# Patient Record
Sex: Male | Born: 2000 | Race: Black or African American | Hispanic: No | Marital: Single | Smoking: Current every day smoker
Health system: Southern US, Community
[De-identification: ages and names within clinical notes are randomized; demographics above are authoritative.]

## PROBLEM LIST (undated history)

## (undated) DIAGNOSIS — Z21 Asymptomatic human immunodeficiency virus [HIV] infection status: Secondary | ICD-10-CM

## (undated) DIAGNOSIS — B20 Human immunodeficiency virus [HIV] disease: Secondary | ICD-10-CM

## (undated) HISTORY — DX: Asymptomatic human immunodeficiency virus (hiv) infection status: Z21

## (undated) HISTORY — DX: Human immunodeficiency virus (HIV) disease: B20

---
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
## 2022-02-11 ENCOUNTER — Other Ambulatory Visit: Payer: Self-pay | Admitting: Internal Medicine

## 2022-04-20 ENCOUNTER — Other Ambulatory Visit: Payer: Self-pay | Admitting: Internal Medicine

## 2022-04-20 DIAGNOSIS — B20 Human immunodeficiency virus [HIV] disease: Secondary | ICD-10-CM

## 2022-05-02 ENCOUNTER — Other Ambulatory Visit: Payer: Medicaid Other

## 2022-05-02 ENCOUNTER — Other Ambulatory Visit: Payer: Self-pay

## 2022-05-02 DIAGNOSIS — B20 Human immunodeficiency virus [HIV] disease: Secondary | ICD-10-CM

## 2022-05-02 DIAGNOSIS — Z113 Encounter for screening for infections with a predominantly sexual mode of transmission: Secondary | ICD-10-CM

## 2022-05-02 DIAGNOSIS — Z79899 Other long term (current) drug therapy: Secondary | ICD-10-CM

## 2022-05-03 ENCOUNTER — Ambulatory Visit: Payer: Medicaid Other | Admitting: Internal Medicine

## 2022-05-06 ENCOUNTER — Other Ambulatory Visit: Payer: Medicaid Other

## 2022-05-10 ENCOUNTER — Ambulatory Visit: Payer: Medicaid Other | Admitting: Internal Medicine

## 2022-06-06 MED ORDER — BIKTARVY 50-200-25 MG PO TABS: 1.0000 | ORAL_TABLET | Freq: Every day | ORAL | 0 refills | Status: DC

## 2022-06-13 ENCOUNTER — Other Ambulatory Visit: Payer: Medicaid Other

## 2022-06-13 ENCOUNTER — Other Ambulatory Visit: Payer: Self-pay | Admitting: Internal Medicine

## 2022-06-13 ENCOUNTER — Other Ambulatory Visit (HOSPITAL_COMMUNITY)
Admission: RE | Admit: 2022-06-13 | Discharge: 2022-06-13 | Disposition: A | Discharge status: Home / Self Care | Payer: Medicaid Other | Source: Ambulatory Visit | Attending: Internal Medicine | Admitting: Internal Medicine

## 2022-06-13 ENCOUNTER — Other Ambulatory Visit: Payer: Self-pay

## 2022-06-13 DIAGNOSIS — Z79899 Other long term (current) drug therapy: Secondary | ICD-10-CM

## 2022-06-13 DIAGNOSIS — B20 Human immunodeficiency virus [HIV] disease: Secondary | ICD-10-CM | POA: Insufficient documentation

## 2022-06-13 DIAGNOSIS — Z113 Encounter for screening for infections with a predominantly sexual mode of transmission: Secondary | ICD-10-CM | POA: Diagnosis present

## 2022-06-15 LAB — URINE CYTOLOGY ANCILLARY ONLY
Chlamydia: NEGATIVE
Comment: NEGATIVE
Comment: NORMAL
Neisseria Gonorrhea: NEGATIVE

## 2022-06-16 LAB — CBC WITH DIFFERENTIAL/PLATELET
Absolute Monocytes: 383 cells/uL (ref 200–950)
Basophils Absolute: 29 cells/uL (ref 0–200)
Basophils Relative: 0.5 %
Eosinophils Absolute: 203 cells/uL (ref 15–500)
Eosinophils Relative: 3.5 %
HCT: 44 % (ref 38.5–50.0)
Hemoglobin: 14.5 g/dL (ref 13.2–17.1)
Lymphs Abs: 2946 cells/uL (ref 850–3900)
MCH: 27.2 pg (ref 27.0–33.0)
MCHC: 33 g/dL (ref 32.0–36.0)
MCV: 82.4 fL (ref 80.0–100.0)
MPV: 9.6 fL (ref 7.5–12.5)
Monocytes Relative: 6.6 %
Neutro Abs: 2239 cells/uL (ref 1500–7800)
Neutrophils Relative %: 38.6 %
Platelets: 249 10*3/uL (ref 140–400)
RBC: 5.34 10*6/uL (ref 4.20–5.80)
RDW: 14.2 % (ref 11.0–15.0)
Total Lymphocyte: 50.8 %
WBC: 5.8 10*3/uL (ref 3.8–10.8)

## 2022-06-16 LAB — LIPID PANEL
Cholesterol: 200 mg/dL — ABNORMAL HIGH (ref <170)
HDL: 66 mg/dL (ref >45)
LDL Cholesterol (Calc): 120 mg/dL (calc) — ABNORMAL HIGH (ref <110)
Non-HDL Cholesterol (Calc): 134 mg/dL (calc) — ABNORMAL HIGH (ref <120)
Total CHOL/HDL Ratio: 3 (calc) (ref <5.0)
Triglycerides: 47 mg/dL (ref <90)

## 2022-06-16 LAB — T-HELPER CELLS (CD4) COUNT (NOT AT ARMC)
Absolute CD4: 921 cells/uL (ref 490–1740)
CD4 T Helper %: 31 % (ref 30–61)
Total lymphocyte count: 2973 cells/uL (ref 850–3900)

## 2022-06-16 LAB — COMPLETE METABOLIC PANEL WITH GFR
AG Ratio: 1.5 (calc) (ref 1.0–2.5)
ALT: 15 U/L (ref 8–46)
AST: 15 U/L (ref 12–32)
Albumin: 4.4 g/dL (ref 3.6–5.1)
Alkaline phosphatase (APISO): 70 U/L (ref 46–169)
BUN: 10 mg/dL (ref 7–20)
CO2: 26 mmol/L (ref 20–32)
Calcium: 9.3 mg/dL (ref 8.9–10.4)
Chloride: 103 mmol/L (ref 98–110)
Creat: 1.08 mg/dL (ref 0.60–1.24)
Globulin: 3 g/dL (calc) (ref 2.1–3.5)
Glucose, Bld: 82 mg/dL (ref 65–99)
Potassium: 4.2 mmol/L (ref 3.8–5.1)
Sodium: 137 mmol/L (ref 135–146)
Total Bilirubin: 0.4 mg/dL (ref 0.2–1.1)
Total Protein: 7.4 g/dL (ref 6.3–8.2)
eGFR: 101 mL/min/{1.73_m2} (ref >=60)

## 2022-06-16 LAB — HIV-1 RNA QUANT-NO REFLEX-BLD
HIV 1 RNA Quant: <20 Copies/mL — ABNORMAL HIGH
HIV-1 RNA Quant, Log: <1.3 Log cps/mL — ABNORMAL HIGH

## 2022-06-16 LAB — RPR: RPR Ser Ql: NONREACTIVE

## 2022-06-21 ENCOUNTER — Telehealth (INDEPENDENT_AMBULATORY_CARE_PROVIDER_SITE_OTHER): Payer: Medicaid Other | Admitting: Internal Medicine

## 2022-06-21 ENCOUNTER — Other Ambulatory Visit: Payer: Self-pay

## 2022-06-21 DIAGNOSIS — B20 Human immunodeficiency virus [HIV] disease: Secondary | ICD-10-CM

## 2022-06-21 DIAGNOSIS — Z79899 Other long term (current) drug therapy: Secondary | ICD-10-CM | POA: Diagnosis not present

## 2022-06-21 MED ORDER — BIKTARVY 50-200-25 MG PO TABS: 1.0000 | ORAL_TABLET | Freq: Every day | ORAL | 11 refills | Status: DC

## 2022-06-21 NOTE — Progress Notes (Signed)
Virtual Visit via Telephone Note  I connected with Kyle Raymond on 06/21/22 at  3:15 PM EDT by telephone and verified that I am speaking with the correct person using two identifiers.  Location: Patient: at home Provider: in clinic   I discussed the limitations, risks, security and privacy concerns of performing an evaluation and management service by telephone and the availability of in person appointments. I also discussed with the patient that there may be a patient responsible charge related to this service. The patient expressed understanding and agreed to proceed.   History of Present Illness: Doing well overall. He has been taking medications daily. No new partners. Continues to work as [employer].   Observations/Objective: Gen = a x o by 3 in nad HEENT = PERRLA, EOMI, no scleral icterus Pulm = no tachypnea nor accessory muscle use Skin = no signs of rash  Assessment and Plan: Hiv disease = reviewed lab results with patient; he is well controlled. Continue on bitkarvy  Long term medication management = cr is stable  Health maintenance = recommend flu and covid booster in the Fall  Follow Up Instructions: See back in 3-6 mo.   I discussed the assessment and treatment plan with the patient. The patient was provided an opportunity to ask questions and all were answered. The patient agreed with the plan and demonstrated an understanding of the instructions.   The patient was advised to call back or seek an in-person evaluation if the symptoms worsen or if the condition fails to improve as anticipated.  I provided 15 minutes of non-face-to-face time during this encounter.   Judyann Munson, MD

## 2022-08-16 ENCOUNTER — Other Ambulatory Visit (HOSPITAL_COMMUNITY): Payer: Self-pay

## 2022-08-16 ENCOUNTER — Encounter: Payer: Self-pay | Admitting: Internal Medicine

## 2022-08-17 ENCOUNTER — Other Ambulatory Visit: Payer: Self-pay | Admitting: Pharmacist

## 2022-08-17 DIAGNOSIS — B20 Human immunodeficiency virus [HIV] disease: Secondary | ICD-10-CM

## 2022-08-17 NOTE — Progress Notes (Signed)
hiv

## 2022-08-17 NOTE — Telephone Encounter (Signed)
Orders placed. Thanks Cece!

## 2022-08-25 ENCOUNTER — Other Ambulatory Visit: Payer: Medicaid Other

## 2022-08-25 ENCOUNTER — Other Ambulatory Visit: Payer: Self-pay

## 2022-08-25 DIAGNOSIS — B20 Human immunodeficiency virus [HIV] disease: Secondary | ICD-10-CM

## 2022-08-28 LAB — HIV-1 RNA QUANT-NO REFLEX-BLD
HIV 1 RNA Quant: NOT DETECTED Copies/mL
HIV-1 RNA Quant, Log: NOT DETECTED Log cps/mL

## 2022-08-28 LAB — RPR: RPR Ser Ql: NONREACTIVE

## 2022-11-29 ENCOUNTER — Other Ambulatory Visit: Payer: Self-pay

## 2022-11-29 ENCOUNTER — Emergency Department (HOSPITAL_BASED_OUTPATIENT_CLINIC_OR_DEPARTMENT_OTHER)
Admission: EM | Admit: 2022-11-29 | Discharge: 2022-11-30 | Disposition: A | Discharge status: Home / Self Care | Payer: Medicaid Other | Attending: Emergency Medicine | Admitting: Emergency Medicine

## 2022-11-29 ENCOUNTER — Encounter (HOSPITAL_BASED_OUTPATIENT_CLINIC_OR_DEPARTMENT_OTHER): Payer: Self-pay

## 2022-11-29 DIAGNOSIS — H9201 Otalgia, right ear: Secondary | ICD-10-CM | POA: Diagnosis present

## 2022-11-29 DIAGNOSIS — Z5321 Procedure and treatment not carried out due to patient leaving prior to being seen by health care provider: Secondary | ICD-10-CM | POA: Insufficient documentation

## 2022-11-29 MED ORDER — IBUPROFEN 800 MG PO TABS
800.0000 mg | ORAL_TABLET | Freq: Once | ORAL | Status: AC
  Administered 2022-11-29: 800 mg via ORAL

## 2022-11-29 NOTE — ED Triage Notes (Signed)
Pt states that this morning he started having right ear pain that has now radiated to his left ear, as well.

## 2022-12-01 ENCOUNTER — Telehealth: Payer: Self-pay

## 2022-12-01 ENCOUNTER — Other Ambulatory Visit: Payer: Self-pay | Admitting: Pharmacist

## 2022-12-01 ENCOUNTER — Encounter: Payer: Self-pay | Admitting: Pharmacist

## 2022-12-01 ENCOUNTER — Other Ambulatory Visit (HOSPITAL_COMMUNITY): Payer: Self-pay

## 2022-12-01 DIAGNOSIS — B20 Human immunodeficiency virus [HIV] disease: Secondary | ICD-10-CM

## 2022-12-01 MED ORDER — CABOTEGRAVIR & RILPIVIRINE ER 600 & 900 MG/3ML IM SUER
1.0000 | INTRAMUSCULAR | 1 refills | Status: DC
  Filled 2022-12-01 – 2022-12-02 (×2): qty 6, 30d supply, fill #0
  Filled 2022-12-27: qty 6, 30d supply, fill #1

## 2022-12-01 MED ORDER — CABOTEGRAVIR & RILPIVIRINE ER 600 & 900 MG/3ML IM SUER
1.0000 | INTRAMUSCULAR | 5 refills | Status: DC
  Filled 2022-12-01: qty 6, 60d supply, fill #0
  Filled 2023-02-24: qty 6, 34d supply, fill #0
  Filled 2023-04-28: qty 6, 34d supply, fill #1
  Filled 2023-06-27: qty 6, 34d supply, fill #2
  Filled 2023-08-31: qty 6, 34d supply, fill #3
  Filled 2023-10-18: qty 6, 34d supply, fill #4

## 2022-12-01 NOTE — Telephone Encounter (Signed)
Patient sent mychart message requesting appointment for today to start cabenuva. Spoke with pharmacy team who did not have any open appointment slots to start cabenuva today.  Messaged patient back stating unfortunately we do not have open appointments today, but someone from the pharmacy team would reach out to him today to help schedule appointment. Juanita Laster, RMA

## 2022-12-02 ENCOUNTER — Other Ambulatory Visit (HOSPITAL_COMMUNITY): Payer: Self-pay

## 2022-12-06 ENCOUNTER — Other Ambulatory Visit: Payer: Self-pay

## 2022-12-07 ENCOUNTER — Telehealth: Payer: Self-pay

## 2022-12-07 NOTE — Telephone Encounter (Signed)
[facility] Patient Advocate Encounter  Patient's medication (Cabenuva) have been couriered to [facility] from [facility] and will be administered on the patient next office visit on 12/13/22.  Jaeline Whobrey , CPhT Specialty Pharmacy Patient [facility] Phone: [phone] Fax:  [phone]  

## 2022-12-14 ENCOUNTER — Ambulatory Visit (INDEPENDENT_AMBULATORY_CARE_PROVIDER_SITE_OTHER): Payer: Medicaid Other | Admitting: Pharmacist

## 2022-12-14 ENCOUNTER — Other Ambulatory Visit: Payer: Self-pay

## 2022-12-14 DIAGNOSIS — B20 Human immunodeficiency virus [HIV] disease: Secondary | ICD-10-CM

## 2022-12-14 MED ORDER — CABOTEGRAVIR & RILPIVIRINE ER 600 & 900 MG/3ML IM SUER
1.0000 | Freq: Once | INTRAMUSCULAR | Status: AC
  Administered 2022-12-14: 1 via INTRAMUSCULAR

## 2022-12-14 NOTE — Progress Notes (Signed)
HPI: Kyle Raymond is a 22 y.o. male who presents to the [place] clinic for [place] administration.  Patient Active Problem List   Diagnosis Date Noted   Healthcare maintenance 08/05/2020   HIV (human immunodeficiency virus infection) ([place]) 06/09/2020   Nausea & vomiting 06/09/2020    Patient's Medications  New Prescriptions   No medications on file  Previous Medications   AZITHROMYCIN (ZITHROMAX Z-PAK) 250 MG TABLET    Take 2 tablets daily for five days.   BICTEGRAVIR-EMTRICITABINE-TENOFOVIR AF (BIKTARVY) 50-200-25 MG TABS TABLET    Take 1 tablet by mouth daily.   CABOTEGRAVIR & RILPIVIRINE ER (CABENUVA) 600 & 900 MG/3ML INJECTION    Inject 1 kit into the muscle every 2 (two) months.   CABOTEGRAVIR & RILPIVIRINE ER (CABENUVA) 600 & 900 MG/3ML INJECTION    Inject 1 kit into the muscle every 30 (thirty) days.   DICYCLOMINE (BENTYL) 10 MG CAPSULE    Take 1 capsule (10 mg total) by mouth 4 (four) times daily -  before meals and at bedtime for 5 days.   MOMETASONE (NASONEX) 50 MCG/ACT NASAL SPRAY    Place 2 sprays into the nose daily.   ONDANSETRON (ZOFRAN) 4 MG TABLET    Take 1 tablet (4 mg total) by mouth every 6 (six) hours.  Modified Medications   No medications on file  Discontinued Medications   No medications on file    Allergies: No Known Allergies  Past Medical History: Past Medical History:  Diagnosis Date   HIV infection ([place])     Social History: Social History   Socioeconomic History   Marital status: Single    Spouse name: Not on file   Number of children: Not on file   Years of education: Not on file   Highest education level: Not on file  Occupational History   Not on file  Tobacco Use   Smoking status: Former   Smokeless tobacco: Never  Substance and Sexual Activity   Alcohol use: Not Currently   Drug use: Yes    Frequency: 2.0 times per week    Types: Marijuana   Sexual activity: Not Currently    Partners: Male    Birth  control/protection: Condom    Comment: declined condoms 20/2022  Other Topics Concern   Not on file  Social History Narrative   Not on file   Social Determinants of Health   Financial Resource Strain: Not on file  Food Insecurity: Not on file  Transportation Needs: Not on file  Physical Activity: Not on file  Stress: Not on file  Social Connections: Not on file    Labs: Lab Results  Component Value Date   HIV1RNAQUANT Not Detected 08/25/2022   HIV1RNAQUANT <20 (H) 06/13/2022   HIV1RNAQUANT <20 (H) 05/20/2021   CD4TABS 685 02/03/2021   CD4TABS 665 10/27/2020   CD4TABS 491 07/06/2020    RPR and STI Lab Results  Component Value Date   LABRPR NON-REACTIVE 08/25/2022   LABRPR NON-REACTIVE 06/13/2022   LABRPR Non Reactive 09/23/2021   LABRPR Non Reactive 08/30/2021   LABRPR REACTIVE (A) 02/03/2021   RPRTITER 1:1 (H) 02/03/2021   RPRTITER 1:2 (H) 10/27/2020    STI Results GC CT  06/13/2022  3:51 PM Negative  Negative   09/23/2021 12:49 PM Negative  Negative   08/30/2021 10:19 AM Negative  Negative   05/20/2021  5:19 PM Negative  Negative   06/09/2020 10:12 AM Negative  Negative     Hepatitis B Lab  Results  Component Value Date   HEPBSAB NON-REACTIVE 06/09/2020   HEPBSAG NON-REACTIVE 06/09/2020   HEPBCAB NON-REACTIVE 06/09/2020   Hepatitis C Lab Results  Component Value Date   HEPCAB NON-REACTIVE 06/09/2020   Hepatitis A Lab Results  Component Value Date   HAV REACTIVE (A) 06/09/2020   Lipids: Lab Results  Component Value Date   CHOL 200 (H) 06/13/2022   TRIG 47 06/13/2022   HDL 66 06/13/2022   CHOLHDL 3.0 06/13/2022   LDLCALC 120 (H) 06/13/2022    Current HIV Regimen: Biktarvy  TARGET DATE: The 3rd of the month  Assessment: [name] presents today for their first initiation injection for Cabenuva. Counseled that [place] is two separate intramuscular injections in the gluteal muscle on each side for each visit. Explained that the second injection  is 30 days after the initial injection then every 2 months thereafter. Discussed the need for viral load monitoring every 2 months for the first 6 months and then periodically afterwards as their provider sees the need. Discussed the rare but significant chance of developing resistance despite compliance. Explained that showing up to injection appointments is very important and warned that if 2 appointments are missed, it will be reassessed by their provider whether they are a good candidate for injection therapy. Counseled on possible side effects associated with the injections such as injection site pain, which is usually mild to moderate in nature, injection site nodules, and injection site reactions. Asked to call the clinic or send me a mychart message if they experience any issues, such as fatigue, nausea, headache, rash, or dizziness. Advised that they can take ibuprofen or tylenol for injection site pain if needed.   Administered cabotegravir 629m/3mL in left upper outer quadrant of the gluteal muscle. Administered rilpivirine 900 mg/374min the right upper outer quadrant of the gluteal muscle. Monitored patient for 10 minutes after injection. Injections were tolerated well without issue. Counseled to stop taking Biktarvy after today's dose and to call with any issues that may arise. Will make follow up appointments for second initiation injection in 30 days and then maintenance injections every 2 months thereafter for 6 months.   Patient requesting HIV viral load today which will be assessed. Reviewed that we will continue monitoring at his next appointments.   Plan: - Stop [place]jections administered - Second initiation injection scheduled for 1/30 with me  - Maintenance injections scheduled for 3/27 with me, 5/28 with Dr. SnBaxter Flatteryand 7/30 with me  - Call with any issues or questions   AmAlfonse SprucePharmD, CPP, BCEhrhardtAARolling Fieldsharmacist Practitioner Infectious  Diseases [facility]

## 2022-12-18 LAB — HIV-1 RNA QUANT-NO REFLEX-BLD
HIV 1 RNA Quant: 110 Copies/mL — ABNORMAL HIGH
HIV-1 RNA Quant, Log: 2.04 Log cps/mL — ABNORMAL HIGH

## 2022-12-19 NOTE — Progress Notes (Signed)
Patient's viral load slightly increased while on oral antiretrovirals. Will repeat HIV RNA in one month at second [place] II injection appointment. Estill Bamberg

## 2022-12-23 IMAGING — CT CT ABD-PELV W/ CM
2 of 4 series · 16 of 46 positions shown, 18 images · IV contrast (omnipaque)
Comparison: None.

CLINICAL DATA: Abdomen pain with diarrhea

EXAM:
CT ABDOMEN AND PELVIS WITH CONTRAST
TECHNIQUE: Multidetector CT imaging of the abdomen and pelvis was performed
using the standard protocol following bolus administration of
intravenous contrast.
CONTRAST:  100mL OMNIPAQUE IOHEXOL 300 MG/ML  SOLN

[Series 2: routine abd/pel with · axial · 0.81mm/px · z∈[-1037,-597]mm · 13 of 98 slices shown, 15 images]
[im 5/98  soft-tissue]
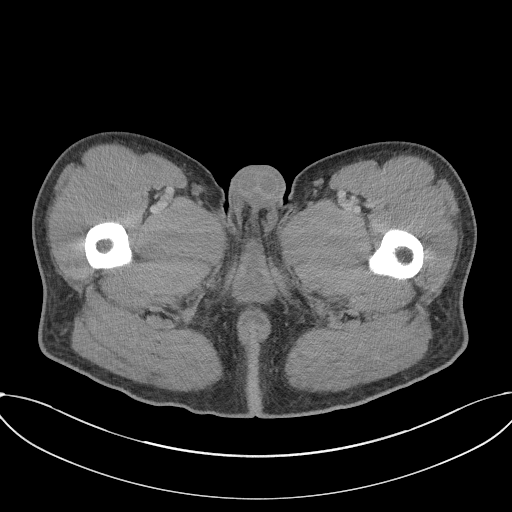
[im 5/98  bone]
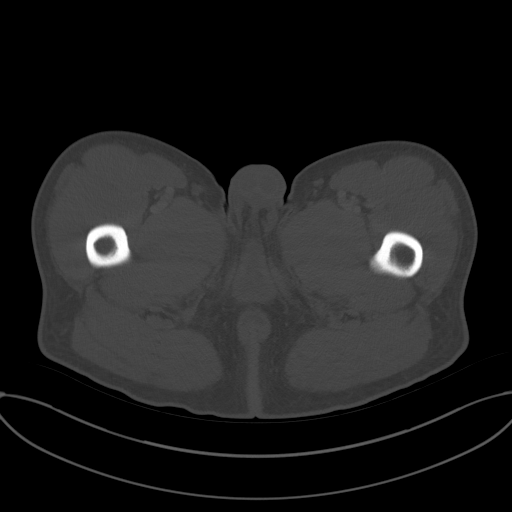
[im 13/98  soft-tissue]
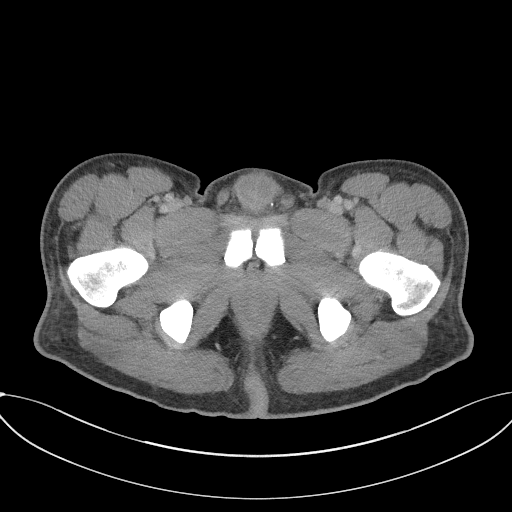
[im 21/98  soft-tissue]
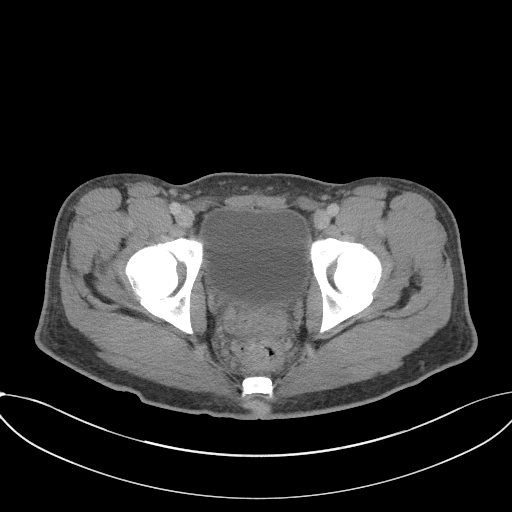
[im 29/98  soft-tissue]
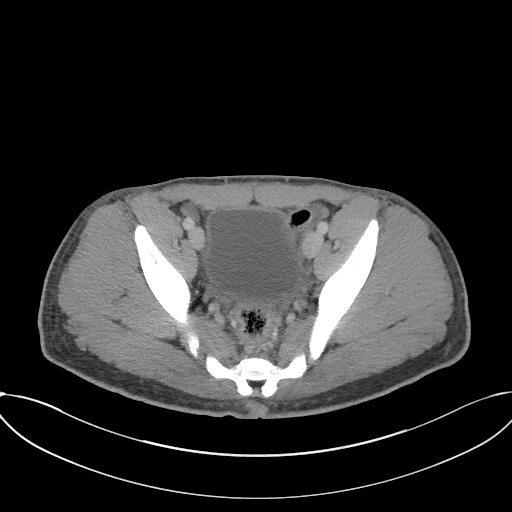
[im 33/98  soft-tissue]
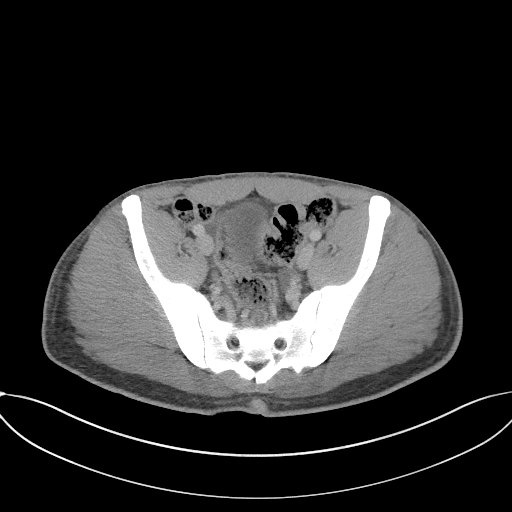
[im 41/98  soft-tissue]
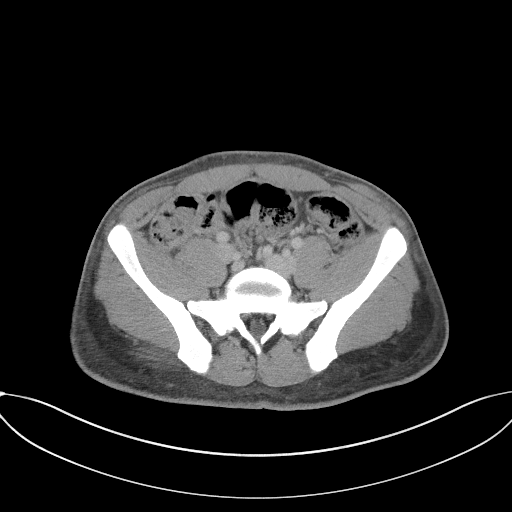
[im 49/98  soft-tissue]
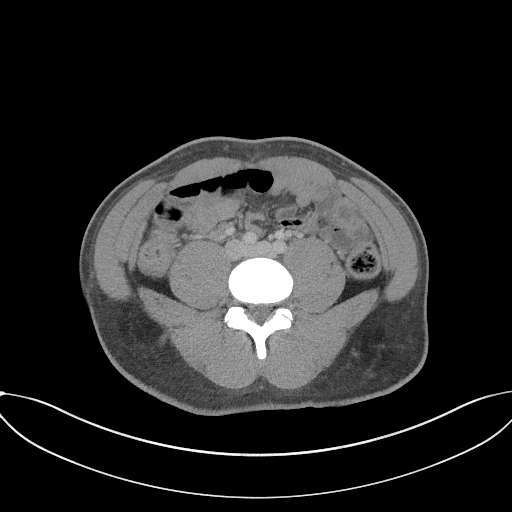
[im 57/98  soft-tissue]
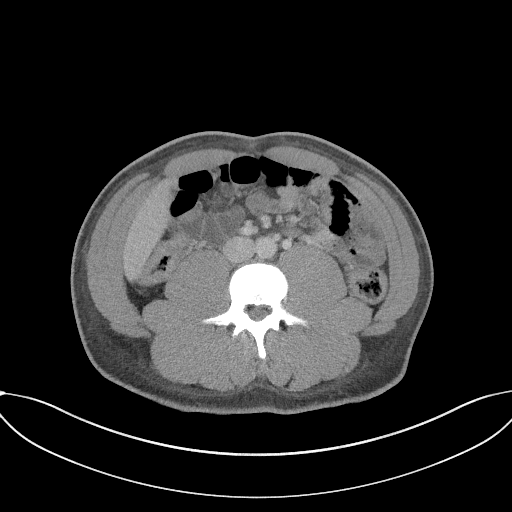
[im 65/98  soft-tissue]
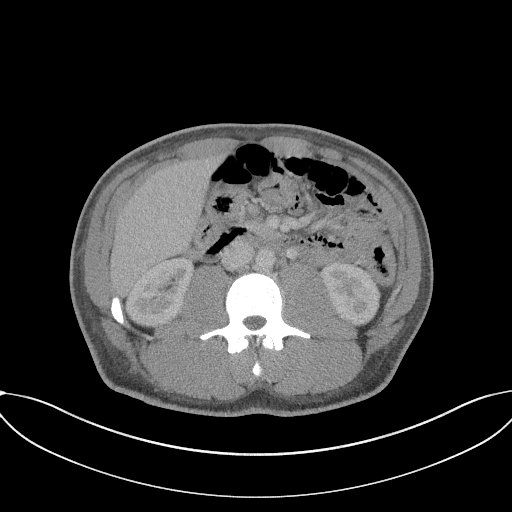
[im 65/98  bone]
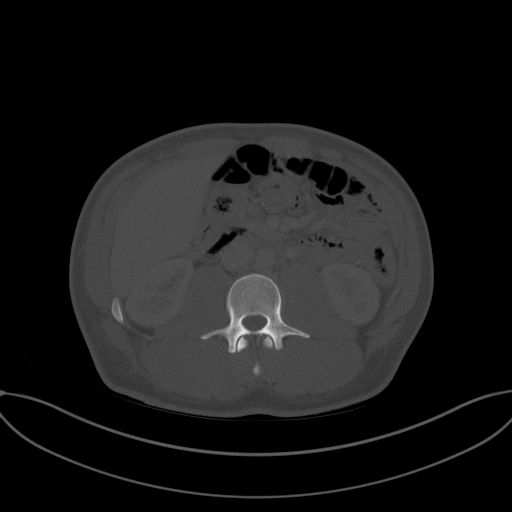
[im 69/98  soft-tissue]
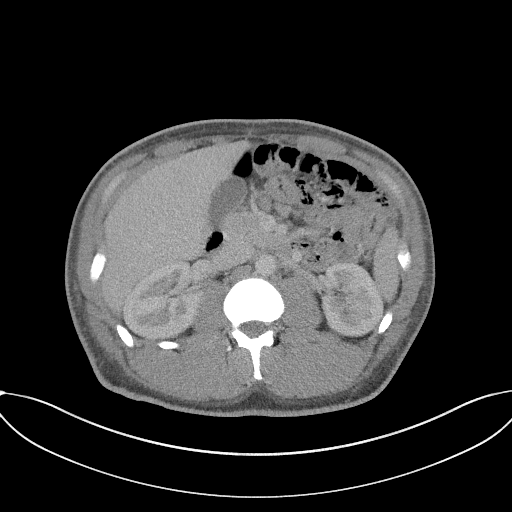
[im 77/98  soft-tissue]
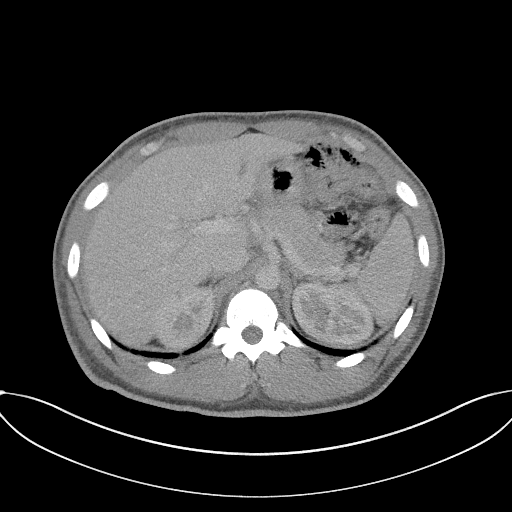
[im 85/98  soft-tissue]
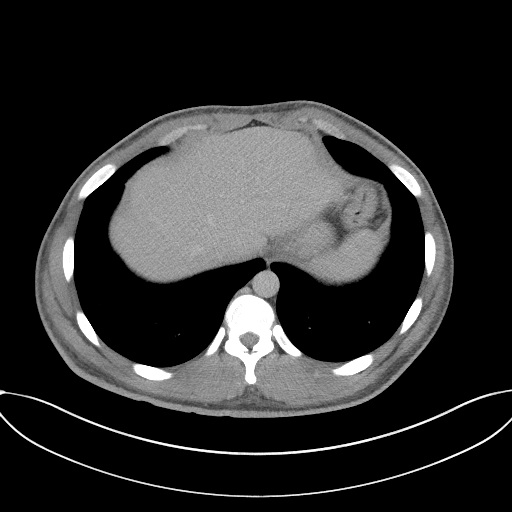
[im 93/98  soft-tissue]
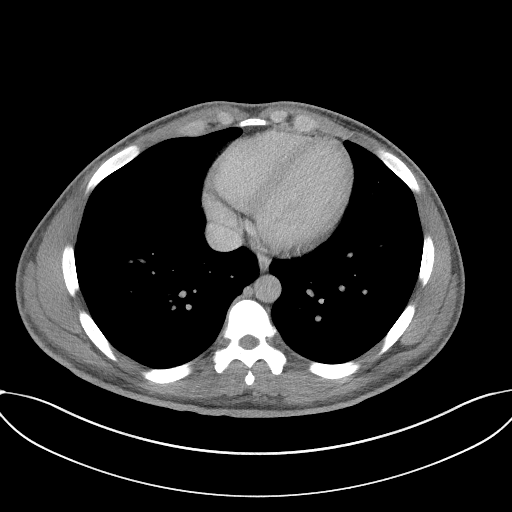

[Series 5: coronal st · coronal · 0.76mm/px · 3 of 89 slices shown]
[im 30/89  soft-tissue]
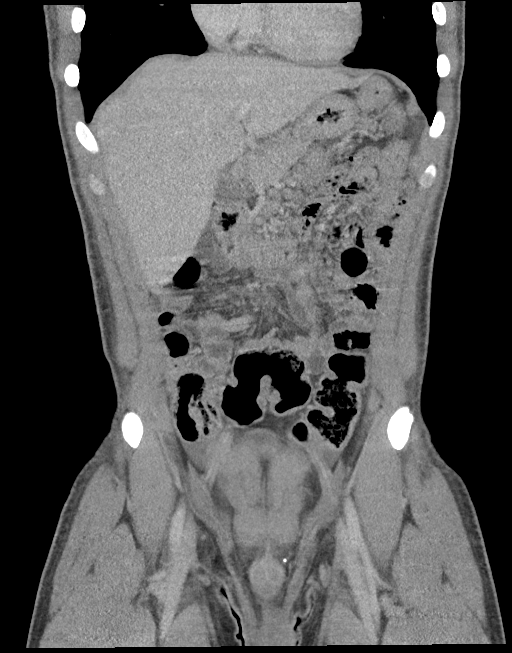
[im 40/89  soft-tissue]
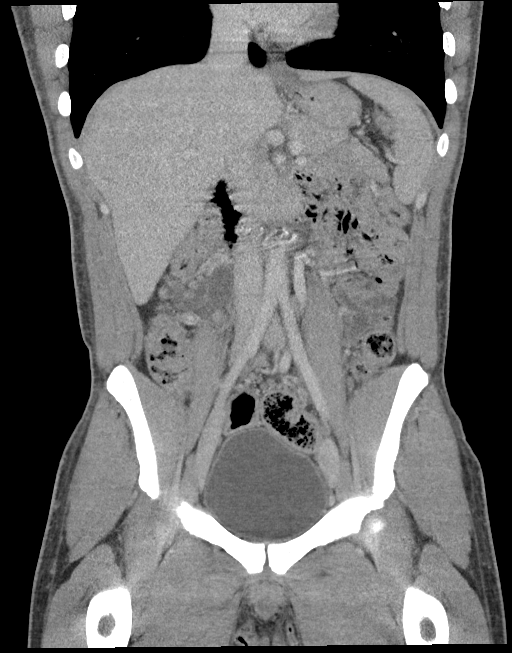
[im 49/89  soft-tissue]
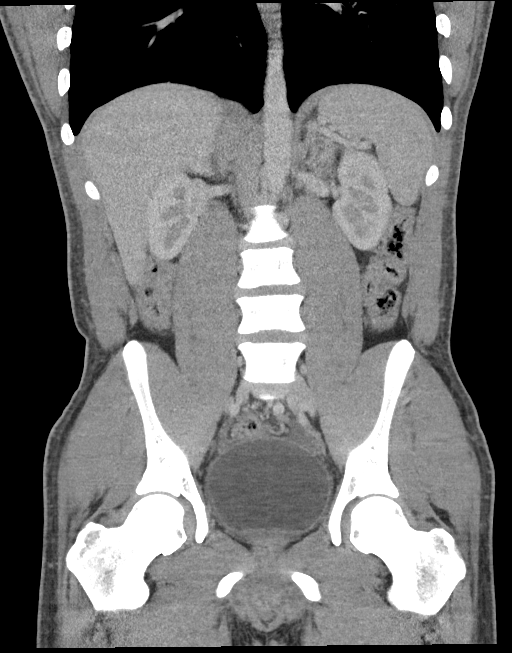

[16 of 46 positions shown; findings below may reference images not displayed]

FINDINGS: Lower chest: No acute abnormality.

Hepatobiliary: No focal liver abnormality is seen. No gallstones,
gallbladder wall thickening, or biliary dilatation.

Pancreas: Unremarkable. No pancreatic ductal dilatation or
surrounding inflammatory changes.

Spleen: Normal in size without focal abnormality.

Adrenals/Urinary Tract: Adrenal glands are unremarkable. Kidneys are
normal, without renal calculi, focal lesion, or hydronephrosis.
Bladder is unremarkable.

Stomach/Bowel: Stomach is within normal limits. Appendix not well
seen. No evidence of bowel wall thickening, distention, or
inflammatory changes.

Vascular/Lymphatic: No significant vascular findings are present. No
enlarged abdominal or pelvic lymph nodes.

Reproductive: Prostate is unremarkable.

Other: No free air.  Tiny fluid in the right lower quadrant.

Musculoskeletal: No acute or significant osseous findings.
IMPRESSION: 1. Trace free fluid in the right lower quadrant
2. No definite CT evidence for acute intra-abdominal or pelvic
abnormality. Appendix not well seen but no right lower quadrant
inflammatory process is visualized.

## 2022-12-27 ENCOUNTER — Other Ambulatory Visit (HOSPITAL_COMMUNITY): Payer: Self-pay

## 2022-12-29 ENCOUNTER — Other Ambulatory Visit: Payer: Self-pay

## 2022-12-29 ENCOUNTER — Other Ambulatory Visit (HOSPITAL_COMMUNITY): Payer: Self-pay

## 2022-12-30 ENCOUNTER — Telehealth: Payer: Self-pay

## 2022-12-30 NOTE — Progress Notes (Signed)
Thankfully this was drawn during his appointment for his first [place], so it should not reflect Cabenuva's activity. We will see what it looks like in a couple weeks.

## 2022-12-30 NOTE — Telephone Encounter (Signed)
[facility] Patient Advocate Encounter  Patient's medication Kern Reap) have been couriered to [facility] from [place] and will be administered on the patient next office visit on 01/10/23.  Ileene Patrick , [place] [facility] Patient [facility] Phone: [phone] Fax:  [phone]

## 2023-01-10 ENCOUNTER — Other Ambulatory Visit (HOSPITAL_COMMUNITY)
Admission: RE | Admit: 2023-01-10 | Discharge: 2023-01-10 | Disposition: A | Discharge status: Home / Self Care | Payer: Medicaid Other | Source: Ambulatory Visit | Attending: Internal Medicine | Admitting: Internal Medicine

## 2023-01-10 ENCOUNTER — Other Ambulatory Visit: Payer: Self-pay

## 2023-01-10 ENCOUNTER — Ambulatory Visit (INDEPENDENT_AMBULATORY_CARE_PROVIDER_SITE_OTHER): Payer: Medicaid Other | Admitting: Pharmacist

## 2023-01-10 DIAGNOSIS — Z113 Encounter for screening for infections with a predominantly sexual mode of transmission: Secondary | ICD-10-CM | POA: Diagnosis present

## 2023-01-10 DIAGNOSIS — Z23 Encounter for immunization: Secondary | ICD-10-CM

## 2023-01-10 DIAGNOSIS — B20 Human immunodeficiency virus [HIV] disease: Secondary | ICD-10-CM

## 2023-01-10 MED ORDER — CABOTEGRAVIR & RILPIVIRINE ER 600 & 900 MG/3ML IM SUER
1.0000 | Freq: Once | INTRAMUSCULAR | Status: AC
  Administered 2023-01-10: 1 via INTRAMUSCULAR

## 2023-01-10 NOTE — Progress Notes (Signed)
HPI: Kyle Raymond is a 22 y.o. male who presents to the [place] clinic for [place] administration.  Patient Active Problem List   Diagnosis Date Noted   Healthcare maintenance 08/05/2020   HIV (human immunodeficiency virus infection) ([place]) 06/09/2020   Nausea & vomiting 06/09/2020    Patient's Medications  New Prescriptions   No medications on file  Previous Medications   AZITHROMYCIN (ZITHROMAX Z-PAK) 250 MG TABLET    Take 2 tablets daily for five days.   CABOTEGRAVIR & RILPIVIRINE ER (CABENUVA) 600 & 900 MG/3ML INJECTION    Inject 1 kit into the muscle every 2 (two) months.   CABOTEGRAVIR & RILPIVIRINE ER (CABENUVA) 600 & 900 MG/3ML INJECTION    Inject 1 kit into the muscle every 30 (thirty) days.   DICYCLOMINE (BENTYL) 10 MG CAPSULE    Take 1 capsule (10 mg total) by mouth 4 (four) times daily -  before meals and at bedtime for 5 days.   MOMETASONE (NASONEX) 50 MCG/ACT NASAL SPRAY    Place 2 sprays into the nose daily.   ONDANSETRON (ZOFRAN) 4 MG TABLET    Take 1 tablet (4 mg total) by mouth every 6 (six) hours.  Modified Medications   No medications on file  Discontinued Medications   No medications on file    Allergies: No Known Allergies  Past Medical History: Past Medical History:  Diagnosis Date   HIV infection ([place])     Social History: Social History   Socioeconomic History   Marital status: Single    Spouse name: Not on file   Number of children: Not on file   Years of education: Not on file   Highest education level: Not on file  Occupational History   Not on file  Tobacco Use   Smoking status: Former   Smokeless tobacco: Never  Substance and Sexual Activity   Alcohol use: Not Currently   Drug use: Yes    Frequency: 2.0 times per week    Types: Marijuana   Sexual activity: Not Currently    Partners: Male    Birth control/protection: Condom    Comment: declined condoms 20/2022  Other Topics Concern   Not on file  Social History  Narrative   Not on file   Social Determinants of Health   Financial Resource Strain: Not on file  Food Insecurity: Not on file  Transportation Needs: Not on file  Physical Activity: Not on file  Stress: Not on file  Social Connections: Not on file    Labs: Lab Results  Component Value Date   HIV1RNAQUANT 110 (H) 12/14/2022   HIV1RNAQUANT Not Detected 08/25/2022   HIV1RNAQUANT <20 (H) 06/13/2022   CD4TABS 685 02/03/2021   CD4TABS 665 10/27/2020   CD4TABS 491 07/06/2020    RPR and STI Lab Results  Component Value Date   LABRPR NON-REACTIVE 08/25/2022   LABRPR NON-REACTIVE 06/13/2022   LABRPR Non Reactive 09/23/2021   LABRPR Non Reactive 08/30/2021   LABRPR REACTIVE (A) 02/03/2021   RPRTITER 1:1 (H) 02/03/2021   RPRTITER 1:2 (H) 10/27/2020    STI Results GC CT  06/13/2022  3:51 PM Negative  Negative   09/23/2021 12:49 PM Negative  Negative   08/30/2021 10:19 AM Negative  Negative   05/20/2021  5:19 PM Negative  Negative   06/09/2020 10:12 AM Negative  Negative     Hepatitis B Lab Results  Component Value Date   HEPBSAB NON-REACTIVE 06/09/2020   HEPBSAG NON-REACTIVE 06/09/2020   HEPBCAB  NON-REACTIVE 06/09/2020   Hepatitis C Lab Results  Component Value Date   HEPCAB NON-REACTIVE 06/09/2020   Hepatitis A Lab Results  Component Value Date   HAV REACTIVE (A) 06/09/2020   Lipids: Lab Results  Component Value Date   CHOL 200 (H) 06/13/2022   TRIG 47 06/13/2022   HDL 66 06/13/2022   CHOLHDL 3.0 06/13/2022   LDLCALC 120 (H) 06/13/2022    TARGET DATE:  The 3rd of the month  Current HIV Regimen: Cabenuva  Assessment: [name] presents today for their maintenance Cabenuva injections. Initial/past injections were tolerated well without issues. No problems with systemic effects of injections. His viral load was slightly elevated at his first injection appointment; states he had missed 2-3 days of Biktarvy when he went on a trip.   Administered cabotegravir  600mg /1mL in left upper outer quadrant of the gluteal muscle. Administered rilpivirine 900 mg/31mL in the right upper outer quadrant of the gluteal muscle. Monitored patient for 10 minutes after injection. Injections were tolerated well without issue. Patient will follow up in 2 months for next injection. Will check HIV RNA today along with HBV sAb. Patient also requests full STI screening with RPR and urine/oral/rectal cytologies.   Discussed multiple vaccines today. He declines flu and COVID and politely defers PCV20 until a later visit. Per [facility], his last Menveo booster was in 2021 and his last Tdap was in 2015.   Plan: - Cabenuva injections administered - Check HIV RNA, HBV sAb, RPR, and urine/oral/rectal cytologies - Next injections scheduled for 3/27 with me, 5/28 with Dr. Baxter Flattery, and 7/30 with me  - Call with any issues or questions  Alfonse Spruce, PharmD, CPP, [place], [place] Pharmacist Practitioner Infectious Diseases [facility]

## 2023-01-11 ENCOUNTER — Encounter: Payer: Self-pay | Admitting: Pharmacist

## 2023-01-11 ENCOUNTER — Other Ambulatory Visit: Payer: Self-pay | Admitting: Pharmacist

## 2023-01-11 DIAGNOSIS — A749 Chlamydial infection, unspecified: Secondary | ICD-10-CM

## 2023-01-11 LAB — CYTOLOGY, (ORAL, ANAL, URETHRAL) ANCILLARY ONLY
Chlamydia: NEGATIVE
Chlamydia: POSITIVE — AB
Comment: NEGATIVE
Comment: NEGATIVE
Comment: NORMAL
Comment: NORMAL
Neisseria Gonorrhea: NEGATIVE
Neisseria Gonorrhea: NEGATIVE

## 2023-01-11 LAB — URINE CYTOLOGY ANCILLARY ONLY
Chlamydia: NEGATIVE
Comment: NEGATIVE
Comment: NORMAL
Neisseria Gonorrhea: NEGATIVE

## 2023-01-11 MED ORDER — DOXYCYCLINE HYCLATE 100 MG PO TABS: 100.0000 mg | ORAL_TABLET | Freq: Two times a day (BID) | ORAL | 0 refills | Status: DC

## 2023-01-12 LAB — HIV-1 RNA QUANT-NO REFLEX-BLD
HIV 1 RNA Quant: <20 Copies/mL — ABNORMAL HIGH
HIV-1 RNA Quant, Log: <1.3 Log cps/mL — ABNORMAL HIGH

## 2023-01-12 LAB — RPR: RPR Ser Ql: NONREACTIVE

## 2023-01-12 LAB — HEPATITIS B SURFACE ANTIBODY,QUALITATIVE: Hep B S Ab: REACTIVE — AB

## 2023-02-24 ENCOUNTER — Other Ambulatory Visit: Payer: Self-pay

## 2023-02-24 ENCOUNTER — Other Ambulatory Visit (HOSPITAL_COMMUNITY): Payer: Self-pay

## 2023-02-28 ENCOUNTER — Other Ambulatory Visit: Payer: Self-pay

## 2023-02-28 ENCOUNTER — Other Ambulatory Visit (HOSPITAL_COMMUNITY): Payer: Self-pay

## 2023-03-02 ENCOUNTER — Telehealth: Payer: Self-pay

## 2023-03-02 NOTE — Telephone Encounter (Signed)
[facility] Patient Advocate Encounter  Patient's medication Kern Reap) have been couriered to [facility] from [place] and will be administered on the patient next office visit on 03/08/23.  Ileene Patrick , [place] [facility] Patient [facility] Phone: [phone] Fax:  [phone]

## 2023-03-08 ENCOUNTER — Other Ambulatory Visit (HOSPITAL_COMMUNITY)
Admission: RE | Admit: 2023-03-08 | Discharge: 2023-03-08 | Disposition: A | Discharge status: Home / Self Care | Payer: Medicaid Other | Source: Ambulatory Visit | Attending: Internal Medicine | Admitting: Internal Medicine

## 2023-03-08 ENCOUNTER — Other Ambulatory Visit: Payer: Self-pay

## 2023-03-08 ENCOUNTER — Ambulatory Visit (INDEPENDENT_AMBULATORY_CARE_PROVIDER_SITE_OTHER): Payer: Medicaid Other | Admitting: Pharmacist

## 2023-03-08 DIAGNOSIS — Z23 Encounter for immunization: Secondary | ICD-10-CM | POA: Diagnosis not present

## 2023-03-08 DIAGNOSIS — B20 Human immunodeficiency virus [HIV] disease: Secondary | ICD-10-CM | POA: Diagnosis present

## 2023-03-08 DIAGNOSIS — Z113 Encounter for screening for infections with a predominantly sexual mode of transmission: Secondary | ICD-10-CM | POA: Diagnosis present

## 2023-03-08 MED ORDER — CABOTEGRAVIR & RILPIVIRINE ER 600 & 900 MG/3ML IM SUER
1.0000 | Freq: Once | INTRAMUSCULAR | Status: AC
  Administered 2023-03-08: 1 via INTRAMUSCULAR

## 2023-03-08 NOTE — Progress Notes (Signed)
HPI: Kyle Raymond is a 22 y.o. male who presents to the [place] clinic for [place] administration.  Patient Active Problem List   Diagnosis Date Noted   Healthcare maintenance 08/05/2020   HIV (human immunodeficiency virus infection) ([place]) 06/09/2020   Nausea & vomiting 06/09/2020    Patient's Medications  New Prescriptions   No medications on file  Previous Medications   AZITHROMYCIN (ZITHROMAX Z-PAK) 250 MG TABLET    Take 2 tablets daily for five days.   CABOTEGRAVIR & RILPIVIRINE ER (CABENUVA) 600 & 900 MG/3ML INJECTION    Inject 1 kit into the muscle every 2 (two) months.   CABOTEGRAVIR & RILPIVIRINE ER (CABENUVA) 600 & 900 MG/3ML INJECTION    Inject 1 kit into the muscle every 30 (thirty) days.   DICYCLOMINE (BENTYL) 10 MG CAPSULE    Take 1 capsule (10 mg total) by mouth 4 (four) times daily -  before meals and at bedtime for 5 days.   DOXYCYCLINE (VIBRA-TABS) 100 MG TABLET    Take 1 tablet (100 mg total) by mouth 2 (two) times daily.   MOMETASONE (NASONEX) 50 MCG/ACT NASAL SPRAY    Place 2 sprays into the nose daily.   ONDANSETRON (ZOFRAN) 4 MG TABLET    Take 1 tablet (4 mg total) by mouth every 6 (six) hours.  Modified Medications   No medications on file  Discontinued Medications   No medications on file    Allergies: No Known Allergies  Past Medical History: Past Medical History:  Diagnosis Date   HIV infection ([place])     Social History: Social History   Socioeconomic History   Marital status: Single    Spouse name: Not on file   Number of children: Not on file   Years of education: Not on file   Highest education level: Not on file  Occupational History   Not on file  Tobacco Use   Smoking status: Former   Smokeless tobacco: Never  Substance and Sexual Activity   Alcohol use: Not Currently   Drug use: Yes    Frequency: 2.0 times per week    Types: Marijuana   Sexual activity: Not Currently    Partners: Male    Birth control/protection:  Condom    Comment: declined condoms 20/2022  Other Topics Concern   Not on file  Social History Narrative   Not on file   Social Determinants of Health   Financial Resource Strain: Not on file  Food Insecurity: Not on file  Transportation Needs: Not on file  Physical Activity: Not on file  Stress: Not on file  Social Connections: Not on file    Labs: Lab Results  Component Value Date   HIV1RNAQUANT <20 (H) 01/10/2023   HIV1RNAQUANT 110 (H) 12/14/2022   HIV1RNAQUANT Not Detected 08/25/2022   CD4TABS 685 02/03/2021   CD4TABS 665 10/27/2020   CD4TABS 491 07/06/2020    RPR and STI Lab Results  Component Value Date   LABRPR NON-REACTIVE 01/10/2023   LABRPR NON-REACTIVE 08/25/2022   LABRPR NON-REACTIVE 06/13/2022   LABRPR Non Reactive 09/23/2021   LABRPR Non Reactive 08/30/2021   RPRTITER 1:1 (H) 02/03/2021   RPRTITER 1:2 (H) 10/27/2020    STI Results GC CT  01/10/2023 11:04 AM Negative    Negative    Negative  Positive    Negative    Negative   06/13/2022  3:51 PM Negative  Negative   09/23/2021 12:49 PM Negative  Negative   08/30/2021 10:19 AM  Negative  Negative   05/20/2021  5:19 PM Negative  Negative   06/09/2020 10:12 AM Negative  Negative     Hepatitis B Lab Results  Component Value Date   HEPBSAB REACTIVE (A) 01/10/2023   HEPBSAG NON-REACTIVE 06/09/2020   HEPBCAB NON-REACTIVE 06/09/2020   Hepatitis C Lab Results  Component Value Date   HEPCAB NON-REACTIVE 06/09/2020   Hepatitis A Lab Results  Component Value Date   HAV REACTIVE (A) 06/09/2020   Lipids: Lab Results  Component Value Date   CHOL 200 (H) 06/13/2022   TRIG 47 06/13/2022   HDL 66 06/13/2022   CHOLHDL 3.0 06/13/2022   LDLCALC 120 (H) 06/13/2022    TARGET DATE: The 3rd  Assessment: Kyle Raymond presents today for his maintenance Cabenuva injections. Past injections were tolerated well without issues. We will get an HIV RNA today. He reports no signs/symptoms of an STI but agrees to  full STI testing today with RPR and urine/oral/rectal cytologies.   The patient is eligible for several vaccines, including flu, covid, HPV, and Mpox. He wishes to start the HPV vaccine series today but politely declines other vaccines.   Administered cabotegravir 600mg /57mL in left upper outer quadrant of the gluteal muscle. Administered rilpivirine 900 mg/95mL in the right upper outer quadrant of the gluteal muscle. No issues with injections. He will follow up in 2 months for next set of injections.  Plan: - Cabenuva injections administered - Get HIV RNA, RPR, and urine/oral/rectal cytologies  - Give first dose of HPV vaccine series today - Next injections scheduled for 05/09/23 with Dr. Baxter Flattery and 07/11/23 with Estill Bamberg - Give second dose of HPV series during May appointment  - Call with any issues or questions   Billey Gosling, PharmD PGY1 Pharmacy Resident 3/27/202411:36 AM

## 2023-03-09 LAB — CYTOLOGY, (ORAL, ANAL, URETHRAL) ANCILLARY ONLY
Chlamydia: NEGATIVE
Chlamydia: NEGATIVE
Comment: NEGATIVE
Comment: NEGATIVE
Comment: NORMAL
Comment: NORMAL
Neisseria Gonorrhea: NEGATIVE
Neisseria Gonorrhea: NEGATIVE

## 2023-03-09 LAB — URINE CYTOLOGY ANCILLARY ONLY
Chlamydia: NEGATIVE
Comment: NEGATIVE
Comment: NORMAL
Neisseria Gonorrhea: NEGATIVE

## 2023-03-11 LAB — RPR: RPR Ser Ql: NONREACTIVE

## 2023-03-11 LAB — HIV-1 RNA QUANT-NO REFLEX-BLD
HIV 1 RNA Quant: NOT DETECTED Copies/mL
HIV-1 RNA Quant, Log: NOT DETECTED Log cps/mL

## 2023-04-12 ENCOUNTER — Other Ambulatory Visit: Payer: Self-pay

## 2023-04-28 ENCOUNTER — Other Ambulatory Visit: Payer: Self-pay

## 2023-04-28 ENCOUNTER — Other Ambulatory Visit (HOSPITAL_COMMUNITY): Payer: Self-pay

## 2023-05-01 ENCOUNTER — Telehealth: Payer: Self-pay

## 2023-05-01 NOTE — Telephone Encounter (Signed)
[facility] Patient Advocate Encounter  Patient's medication (Cabenuva) have been couriered to [facility] from [facility] and will be administered on the patient next office visit on 05/09/23.  Camilo Mander , CPhT Specialty Pharmacy Patient [facility] Phone: [phone] Fax:  [phone]  

## 2023-05-09 ENCOUNTER — Encounter: Payer: Self-pay | Admitting: Internal Medicine

## 2023-05-09 ENCOUNTER — Other Ambulatory Visit: Payer: Self-pay

## 2023-05-09 ENCOUNTER — Other Ambulatory Visit (HOSPITAL_COMMUNITY): Payer: Self-pay

## 2023-05-09 ENCOUNTER — Other Ambulatory Visit (HOSPITAL_COMMUNITY)
Admission: RE | Admit: 2023-05-09 | Discharge: 2023-05-09 | Disposition: A | Discharge status: Home / Self Care | Payer: Medicaid Other | Source: Ambulatory Visit | Attending: Internal Medicine | Admitting: Internal Medicine

## 2023-05-09 ENCOUNTER — Ambulatory Visit (INDEPENDENT_AMBULATORY_CARE_PROVIDER_SITE_OTHER): Payer: Medicaid Other | Admitting: Internal Medicine

## 2023-05-09 VITALS — BP 113/75 | HR 75 | Temp 98.3°F | Wt 207.0 lb

## 2023-05-09 DIAGNOSIS — Z23 Encounter for immunization: Secondary | ICD-10-CM

## 2023-05-09 DIAGNOSIS — B20 Human immunodeficiency virus [HIV] disease: Secondary | ICD-10-CM | POA: Diagnosis present

## 2023-05-09 DIAGNOSIS — Z113 Encounter for screening for infections with a predominantly sexual mode of transmission: Secondary | ICD-10-CM

## 2023-05-09 MED ORDER — CABOTEGRAVIR & RILPIVIRINE ER 600 & 900 MG/3ML IM SUER
1.0000 | Freq: Once | INTRAMUSCULAR | Status: AC
  Administered 2023-05-09: 1 via INTRAMUSCULAR

## 2023-05-09 NOTE — Progress Notes (Unsigned)
RFV: follow up for hiv disease, cabenuva injection  Patient ID: Kyle Raymond, male   DOB: [birth year], 22 y.o.   MRN: [id number]  HPI Kyle Raymond is a Kyle Raymond with well controlled hiv disease, currently on cabenuva injections, 685/VL<20 (end of March 2024).  He reports being in good health. Since we last saw him, he has new position, He is Production designer, theatre/television/film at [employer].  He has had an Exclusive partner for 9 months. -- MSM/  Outpatient Encounter Medications as of 05/09/2023  Medication Sig   cabotegravir & rilpivirine ER (CABENUVA) 600 & 900 MG/3ML injection Inject 1 kit into the muscle every 2 (two) months.   azithromycin (ZITHROMAX Z-PAK) 250 MG tablet Take 2 tablets daily for five days. (Patient not taking: Reported on 05/09/2023)   dicyclomine (BENTYL) 10 MG capsule Take 1 capsule (10 mg total) by mouth 4 (four) times daily -  before meals and at bedtime for 5 days.   doxycycline (VIBRA-TABS) 100 MG tablet Take 1 tablet (100 mg total) by mouth 2 (two) times daily. (Patient not taking: Reported on 05/09/2023)   mometasone (NASONEX) 50 MCG/ACT nasal spray Place 2 sprays into the nose daily. (Patient not taking: Reported on 02/03/2021)   ondansetron (ZOFRAN) 4 MG tablet Take 1 tablet (4 mg total) by mouth every 6 (six) hours. (Patient not taking: Reported on 05/09/2023)   No facility-administered encounter medications on file as of 05/09/2023.     Patient Active Problem List   Diagnosis Date Noted   Healthcare maintenance 08/05/2020   HIV (human immunodeficiency virus infection) (HCC) 06/09/2020   Nausea & vomiting 06/09/2020     Health Maintenance Due  Topic Date Due   COVID-19 Vaccine (2 - Pfizer risk series) 08/12/2020   DTaP/Tdap/Td (1 - Tdap) Never done   HPV VACCINES (2 - Risk male 3-dose series) 04/05/2023     Review of Systems Review of Systems  Constitutional: Negative for fever, chills, diaphoresis, activity change, appetite change, fatigue and unexpected weight change.  HENT:  Negative for congestion, sore throat, rhinorrhea, sneezing, trouble swallowing and sinus pressure.  Eyes: Negative for photophobia and visual disturbance.  Respiratory: Negative for cough, chest tightness, shortness of breath, wheezing and stridor.  Cardiovascular: Negative for chest pain, palpitations and leg swelling.  Gastrointestinal: Negative for nausea, vomiting, abdominal pain, diarrhea, constipation, blood in stool, abdominal distention and anal bleeding.  Genitourinary: Negative for dysuria, hematuria, flank pain and difficulty urinating.  Musculoskeletal: Negative for myalgias, back pain, joint swelling, arthralgias and gait problem.  Skin: Negative for color change, pallor, rash and wound.  Neurological: Negative for dizziness, tremors, weakness and light-headedness.  Hematological: Negative for adenopathy. Does not bruise/bleed easily.  Psychiatric/Behavioral: Negative for behavioral problems, confusion, sleep disturbance, dysphoric mood, decreased concentration and agitation.   Physical Exam   Wt 207 lb (93.9 kg)   BMI 28.07 kg/m   Physical Exam  Constitutional: He is oriented to person, place, and time. He appears well-developed and well-nourished. No distress.  HENT:  Mouth/Throat: Oropharynx is clear and moist. No oropharyngeal exudate.  Cardiovascular: Normal rate, regular rhythm and normal heart sounds. Exam reveals no gallop and no friction rub.  No murmur heard.  Pulmonary/Chest: Effort normal and breath sounds normal. No respiratory distress. He has no wheezes.  Abdominal: Soft. Bowel sounds are normal. He exhibits no distension. There is no tenderness.  Lymphadenopathy:  He has no cervical adenopathy.  Neurological: He is alert and oriented to person, place, and time.  Skin: Skin is warm  and dry. No rash noted. No erythema.  Psychiatric: He has a normal mood and affect. His behavior is normal.   Lab Results  Component Value Date   CD4TCELL 31 06/13/2022   Lab  Results  Component Value Date   CD4TABS 685 02/03/2021   CD4TABS 665 10/27/2020   CD4TABS 491 07/06/2020   Lab Results  Component Value Date   HIV1RNAQUANT Not Detected 03/08/2023   Lab Results  Component Value Date   HEPBSAB REACTIVE (A) 01/10/2023   Lab Results  Component Value Date   LABRPR NON-REACTIVE 03/08/2023    CBC Lab Results  Component Value Date   WBC 5.8 06/13/2022   RBC 5.34 06/13/2022   HGB 14.5 06/13/2022   HCT 44.0 06/13/2022   PLT 249 06/13/2022   MCV 82.4 06/13/2022   MCH 27.2 06/13/2022   MCHC 33.0 06/13/2022   RDW 14.2 06/13/2022   LYMPHSABS 2,946 06/13/2022   MONOABS 0.7 05/26/2007   EOSABS 203 06/13/2022    BMET Lab Results  Component Value Date   NA 137 06/13/2022   K 4.2 06/13/2022   CL 103 06/13/2022   CO2 26 06/13/2022   GLUCOSE 82 06/13/2022   BUN 10 06/13/2022   CREATININE 1.08 06/13/2022   CALCIUM 9.3 06/13/2022   GFRNONAA >60 12/19/2021   GFRAA 120 05/20/2021      Assessment and Plan HIV disease = recently lab work with cd 4 count and HIV VL are well controlled. We will check labs at next visit. Will give cabenuva injection at this visit. We will set up appt for 2 months from now  Hx of sexual activity = will check for STI. Only has 1 exclusive partner who he has been seeing for the past 9 months.  Health promotion = started on hpv vaccine series 2 months ago. we will do 2nd dose today and last dose at last dose in sept

## 2023-05-10 LAB — CYTOLOGY, (ORAL, ANAL, URETHRAL) ANCILLARY ONLY
Chlamydia: NEGATIVE
Chlamydia: NEGATIVE
Comment: NEGATIVE
Comment: NEGATIVE
Comment: NORMAL
Comment: NORMAL
Neisseria Gonorrhea: NEGATIVE
Neisseria Gonorrhea: NEGATIVE

## 2023-05-10 LAB — URINE CYTOLOGY ANCILLARY ONLY
Chlamydia: NEGATIVE
Comment: NEGATIVE
Comment: NORMAL
Neisseria Gonorrhea: NEGATIVE

## 2023-06-27 ENCOUNTER — Other Ambulatory Visit (HOSPITAL_COMMUNITY): Payer: Self-pay

## 2023-06-30 ENCOUNTER — Other Ambulatory Visit (HOSPITAL_COMMUNITY): Payer: Self-pay

## 2023-07-04 ENCOUNTER — Telehealth: Payer: Self-pay

## 2023-07-04 NOTE — Telephone Encounter (Signed)
[facility] Patient Advocate Encounter  Patient's medication Renaldo Harrison) have been couriered to [facility] from [employer] and will be administered on the patient next office visit on 07/11/23.  Clearance Coots , CPhT Specialty Pharmacy Patient [facility] for Infectious Disease Phone: [phone] Fax:  [phone]

## 2023-07-11 ENCOUNTER — Other Ambulatory Visit (HOSPITAL_COMMUNITY)
Admission: RE | Admit: 2023-07-11 | Discharge: 2023-07-11 | Disposition: A | Discharge status: Home / Self Care | Payer: Medicaid Other | Source: Ambulatory Visit | Attending: Internal Medicine | Admitting: Internal Medicine

## 2023-07-11 ENCOUNTER — Other Ambulatory Visit: Payer: Self-pay

## 2023-07-11 ENCOUNTER — Ambulatory Visit: Payer: Medicaid Other | Admitting: Pharmacist

## 2023-07-11 DIAGNOSIS — B20 Human immunodeficiency virus [HIV] disease: Secondary | ICD-10-CM

## 2023-07-11 DIAGNOSIS — Z113 Encounter for screening for infections with a predominantly sexual mode of transmission: Secondary | ICD-10-CM | POA: Insufficient documentation

## 2023-07-11 MED ORDER — CABOTEGRAVIR & RILPIVIRINE ER 600 & 900 MG/3ML IM SUER
1.0000 | Freq: Once | INTRAMUSCULAR | Status: AC
Start: 2023-07-11 — End: 2023-07-11
  Administered 2023-07-11: 1 via INTRAMUSCULAR

## 2023-07-11 NOTE — Progress Notes (Signed)
HPI: Kyle Raymond is a 22 y.o. male who presents to the [facility] pharmacy clinic for [place] administration.  Patient Active Problem List   Diagnosis Date Noted   Healthcare maintenance 08/05/2020   HIV (human immunodeficiency virus infection) (HCC) 06/09/2020   Nausea & vomiting 06/09/2020    Patient's Medications  New Prescriptions   No medications on file  Previous Medications   AZITHROMYCIN (ZITHROMAX Z-PAK) 250 MG TABLET    Take 2 tablets daily for five days.   CABOTEGRAVIR & RILPIVIRINE ER (CABENUVA) 600 & 900 MG/3ML INJECTION    Inject 1 kit into the muscle every 2 (two) months.   DICYCLOMINE (BENTYL) 10 MG CAPSULE    Take 1 capsule (10 mg total) by mouth 4 (four) times daily -  before meals and at bedtime for 5 days.   DOXYCYCLINE (VIBRA-TABS) 100 MG TABLET    Take 1 tablet (100 mg total) by mouth 2 (two) times daily.   MOMETASONE (NASONEX) 50 MCG/ACT NASAL SPRAY    Place 2 sprays into the nose daily.   ONDANSETRON (ZOFRAN) 4 MG TABLET    Take 1 tablet (4 mg total) by mouth every 6 (six) hours.  Modified Medications   No medications on file  Discontinued Medications   No medications on file    Allergies: No Known Allergies  Past Medical History: Past Medical History:  Diagnosis Date   HIV infection (HCC)     Social History: Social History   Socioeconomic History   Marital status: Single    Spouse name: Not on file   Number of children: Not on file   Years of education: Not on file   Highest education level: Not on file  Occupational History   Not on file  Tobacco Use   Smoking status: Every Day    Types: E-cigarettes   Smokeless tobacco: Never  Substance and Sexual Activity   Alcohol use: Not Currently   Drug use: Yes    Frequency: 2.0 times per week    Types: Marijuana   Sexual activity: Not Currently    Partners: Male    Birth control/protection: Condom    Comment: declined condoms 20/2022  Other Topics Concern   Not on file  Social History  Narrative   Not on file   Social Determinants of Health   Financial Resource Strain: Not on File (05/11/2023)   [facility] [employer] [facility] [employer]   [employer]    Financial Resource Strain: 0  Food Insecurity: Not on File (05/11/2023)   Received from [place], [place]   Food Insecurity    Food: 0  Transportation Needs: Not on File (05/11/2023)   Received from [employer], [employer] Needs    Transportation: 0  Physical Activity: Not on File (05/11/2023)   Received from [place], [place]   Physical Activity    Physical Activity: 0  Stress: Not on File (05/11/2023)   Received from [facility], [place]   Stress    Stress: 0  Social Connections: Not on File (05/11/2023)   Received from [place], [place]   Social Connections    Social Connections and Isolation: 0    Labs: Lab Results  Component Value Date   HIV1RNAQUANT Not Detected 03/08/2023   HIV1RNAQUANT <20 (H) 01/10/2023   HIV1RNAQUANT 110 (H) 12/14/2022   CD4TABS 685 02/03/2021   CD4TABS 665 10/27/2020   CD4TABS 491 07/06/2020    RPR and STI Lab Results  Component Value Date   LABRPR NON-REACTIVE 03/08/2023   LABRPR  NON-REACTIVE 01/10/2023   LABRPR NON-REACTIVE 08/25/2022   LABRPR NON-REACTIVE 06/13/2022   LABRPR Non Reactive 09/23/2021   RPRTITER 1:1 (H) 02/03/2021   RPRTITER 1:2 (H) 10/27/2020    STI Results GC CT  05/09/2023  1:57 PM Negative    Negative    Negative  Negative    Negative    Negative   03/08/2023 11:25 AM Negative    Negative    Negative  Negative    Negative    Negative   01/10/2023 11:04 AM Negative    Negative    Negative  Positive    Negative    Negative   06/13/2022  3:51 PM Negative  Negative   09/23/2021 12:49 PM Negative  Negative   08/30/2021 10:19 AM Negative  Negative   05/20/2021  5:19 PM Negative  Negative   06/09/2020 10:12 AM Negative  Negative     Hepatitis B Lab Results  Component Value Date   HEPBSAB REACTIVE (A) 01/10/2023   HEPBSAG NON-REACTIVE 06/09/2020    HEPBCAB NON-REACTIVE 06/09/2020   Hepatitis C Lab Results  Component Value Date   HEPCAB NON-REACTIVE 06/09/2020   Hepatitis A Lab Results  Component Value Date   HAV REACTIVE (A) 06/09/2020   Lipids: Lab Results  Component Value Date   CHOL 200 (H) 06/13/2022   TRIG 47 06/13/2022   HDL 66 06/13/2022   CHOLHDL 3.0 06/13/2022   LDLCALC 120 (H) 06/13/2022    TARGET DATE:  The 3rd of the month  Current HIV Regimen: Cabenuva  Assessment: Kyle Raymond presents today for their maintenance Cabenuva injections. Initial/past injections were tolerated well without issues. No problems with systemic effects of injections.   Administered cabotegravir 600mg /43mL in left upper outer quadrant of the gluteal muscle. Administered rilpivirine 900 mg/52mL in the right upper outer quadrant of the gluteal muscle. Monitored patient for 10 minutes after injection. Injections were tolerated well without issue. Patient will follow up in 2 months for next injection. Will check HIV RNA today along with routine STI screening.   Patient politely declines PCV20 today but will consider at next visit. Due for third and final HPV vaccine at his next visit as well. Reviewed the earliest appointment Dr. Drue Second had within his window next time is at the end of his 2-week window; emphasized importance of making this appointment.   Plan: - Cabenuva injections administered - Check HIV RNA, RPR, and urine/oral/rectal cytologies - Next injections scheduled for 10/9 with Dr. Drue Second and 11/26 with me  - Call with any issues or questions  Margarite Gouge, PharmD, CPP, BCIDP, AAHIVP Clinical Pharmacist Practitioner Infectious Diseases Clinical Pharmacist [facility]

## 2023-08-31 ENCOUNTER — Other Ambulatory Visit (HOSPITAL_COMMUNITY): Payer: Self-pay

## 2023-09-04 ENCOUNTER — Other Ambulatory Visit: Payer: Self-pay

## 2023-09-05 ENCOUNTER — Telehealth: Payer: Self-pay

## 2023-09-05 NOTE — Telephone Encounter (Signed)
[facility] Patient Advocate Encounter  Patient's medication Renaldo Harrison) have been couriered to [facility] from [employer] and will be administered on the patient next office visit on 09/20/23.  Clearance Coots , CPhT Specialty Pharmacy Patient [facility] for Infectious Disease Phone: [phone] Fax:  [phone]

## 2023-09-20 ENCOUNTER — Other Ambulatory Visit (HOSPITAL_COMMUNITY)
Admission: RE | Admit: 2023-09-20 | Discharge: 2023-09-20 | Disposition: A | Discharge status: Home / Self Care | Payer: Medicaid Other | Source: Ambulatory Visit | Attending: Internal Medicine | Admitting: Internal Medicine

## 2023-09-20 ENCOUNTER — Encounter: Payer: Self-pay | Admitting: Internal Medicine

## 2023-09-20 ENCOUNTER — Ambulatory Visit: Payer: Medicaid Other | Admitting: Internal Medicine

## 2023-09-20 ENCOUNTER — Other Ambulatory Visit: Payer: Self-pay

## 2023-09-20 VITALS — BP 117/73 | HR 70 | Temp 98.0°F | Resp 16 | Wt 211.0 lb

## 2023-09-20 DIAGNOSIS — Z113 Encounter for screening for infections with a predominantly sexual mode of transmission: Secondary | ICD-10-CM

## 2023-09-20 DIAGNOSIS — Z23 Encounter for immunization: Secondary | ICD-10-CM

## 2023-09-20 DIAGNOSIS — B20 Human immunodeficiency virus [HIV] disease: Secondary | ICD-10-CM

## 2023-09-20 MED ORDER — CABOTEGRAVIR & RILPIVIRINE ER 600 & 900 MG/3ML IM SUER
1.0000 | Freq: Once | INTRAMUSCULAR | Status: AC
Start: 2023-09-20 — End: 2023-09-20
  Administered 2023-09-20: 1 via INTRAMUSCULAR

## 2023-09-20 NOTE — Progress Notes (Signed)
Patient ID: Kyle Raymond, male   DOB: 05/28/2001, 22 y.o.   MRN: [id number]  HPI Kyle Raymond  with HIV disease, well controlled on cabenuva injections. Has been in good health. Here today for  Cabenuva injection And hpv vaccine  No unprotected sex Has been on good health.  Outpatient Encounter Medications as of 09/20/2023  Medication Sig   cabotegravir & rilpivirine ER (CABENUVA) 600 & 900 MG/3ML injection Inject 1 kit into the muscle every 2 (two) months.   azithromycin (ZITHROMAX Z-PAK) 250 MG tablet Take 2 tablets daily for five days. (Patient not taking: Reported on 09/20/2023)   dicyclomine (BENTYL) 10 MG capsule Take 1 capsule (10 mg total) by mouth 4 (four) times daily -  before meals and at bedtime for 5 days.   doxycycline (VIBRA-TABS) 100 MG tablet Take 1 tablet (100 mg total) by mouth 2 (two) times daily. (Patient not taking: Reported on 09/20/2023)   mometasone (NASONEX) 50 MCG/ACT nasal spray Place 2 sprays into the nose daily. (Patient not taking: Reported on 09/20/2023)   ondansetron (ZOFRAN) 4 MG tablet Take 1 tablet (4 mg total) by mouth every 6 (six) hours. (Patient not taking: Reported on 09/20/2023)   No facility-administered encounter medications on file as of 09/20/2023.     Patient Active Problem List   Diagnosis Date Noted   Healthcare maintenance 08/05/2020   HIV (human immunodeficiency virus infection) (HCC) 06/09/2020   Nausea & vomiting 06/09/2020     Health Maintenance Due  Topic Date Due   DTaP/Tdap/Td (1 - Tdap) Never done   INFLUENZA VACCINE  07/13/2023   HPV VACCINES (3 - Risk male 3-dose series) 09/09/2023     Review of Systems  Constitutional: Negative for fever, chills, diaphoresis, activity change, appetite change, fatigue and unexpected weight change.  HENT: Negative for congestion, sore throat, rhinorrhea, sneezing, trouble swallowing and sinus pressure.  Eyes: Negative for photophobia and visual disturbance.  Respiratory: Negative for  cough, chest tightness, shortness of breath, wheezing and stridor.  Cardiovascular: Negative for chest pain, palpitations and leg swelling.  Gastrointestinal: Negative for nausea, vomiting, abdominal pain, diarrhea, constipation, blood in stool, abdominal distention and anal bleeding.  Genitourinary: Negative for dysuria, hematuria, flank pain and difficulty urinating.  Musculoskeletal: Negative for myalgias, back pain, joint swelling, arthralgias and gait problem.  Skin: Negative for color change, pallor, rash and wound.  Neurological: Negative for dizziness, tremors, weakness and light-headedness.  Hematological: Negative for adenopathy. Does not bruise/bleed easily.  Psychiatric/Behavioral: Negative for behavioral problems, confusion, sleep disturbance, dysphoric mood, decreased concentration and agitation.   Physical Exam   BP 117/73   Pulse 70   Temp 98 F (36.7 C) (Oral)   Resp 16   Wt 211 lb (95.7 kg)   SpO2 99%   BMI 28.62 kg/m   Physical Exam  Constitutional: He is oriented to person, place, and time. He appears well-developed and well-nourished. No distress.  HENT:  Mouth/Throat: Oropharynx is clear and moist. No oropharyngeal exudate.  Cardiovascular: Normal rate, regular rhythm and normal heart sounds. Exam reveals no gallop and no friction rub.  No murmur heard.  Pulmonary/Chest: Effort normal and breath sounds normal. No respiratory distress. He has no wheezes.  Abdominal: Soft. Bowel sounds are normal. He exhibits no distension. There is no tenderness.  Lymphadenopathy:  He has no cervical adenopathy.  Neurological: He is alert and oriented to person, place, and time.  Skin: Skin is warm and dry. No rash noted. No erythema.  Psychiatric:  He has a normal mood and affect. His behavior is normal.   Lab Results  Component Value Date   CD4TCELL 31 06/13/2022   Lab Results  Component Value Date   CD4TABS 685 02/03/2021   CD4TABS 665 10/27/2020   CD4TABS 491  07/06/2020   Lab Results  Component Value Date   HIV1RNAQUANT <20 (H) 07/11/2023   Lab Results  Component Value Date   HEPBSAB REACTIVE (A) 01/10/2023   Lab Results  Component Value Date   LABRPR NON-REACTIVE 07/11/2023    CBC Lab Results  Component Value Date   WBC 5.8 06/13/2022   RBC 5.34 06/13/2022   HGB 14.5 06/13/2022   HCT 44.0 06/13/2022   PLT 249 06/13/2022   MCV 82.4 06/13/2022   MCH 27.2 06/13/2022   MCHC 33.0 06/13/2022   RDW 14.2 06/13/2022   LYMPHSABS 2,946 06/13/2022   MONOABS 0.7 05/26/2007   EOSABS 203 06/13/2022    BMET Lab Results  Component Value Date   NA 137 06/13/2022   K 4.2 06/13/2022   CL 103 06/13/2022   CO2 26 06/13/2022   GLUCOSE 82 06/13/2022   BUN 10 06/13/2022   CREATININE 1.08 06/13/2022   CALCIUM 9.3 06/13/2022   GFRNONAA >60 12/19/2021   GFRAA 120 05/20/2021      Assessment and Plan  Hiv disease= cabenuva today. Will check labs to see that he is well controlled  Long term medication management = will check cr to see still stable  Health maintenance = will check rpr, viral load. Sti screening Hpv vaccine today

## 2023-09-21 LAB — CYTOLOGY, (ORAL, ANAL, URETHRAL) ANCILLARY ONLY
Chlamydia: NEGATIVE
Chlamydia: NEGATIVE
Comment: NEGATIVE
Comment: NEGATIVE
Comment: NORMAL
Comment: NORMAL
Neisseria Gonorrhea: NEGATIVE
Neisseria Gonorrhea: POSITIVE — AB

## 2023-09-21 LAB — URINE CYTOLOGY ANCILLARY ONLY
Chlamydia: NEGATIVE
Comment: NEGATIVE
Comment: NORMAL
Neisseria Gonorrhea: NEGATIVE

## 2023-09-22 ENCOUNTER — Ambulatory Visit (INDEPENDENT_AMBULATORY_CARE_PROVIDER_SITE_OTHER): Payer: Medicaid Other

## 2023-09-22 ENCOUNTER — Other Ambulatory Visit: Payer: Self-pay

## 2023-09-22 DIAGNOSIS — A549 Gonococcal infection, unspecified: Secondary | ICD-10-CM | POA: Diagnosis present

## 2023-09-22 LAB — RPR: RPR Ser Ql: NONREACTIVE

## 2023-09-22 LAB — HIV-1 RNA QUANT-NO REFLEX-BLD
HIV 1 RNA Quant: NOT DETECTED {copies}/mL
HIV-1 RNA Quant, Log: NOT DETECTED {Log}

## 2023-09-22 MED ORDER — CEFTRIAXONE SODIUM 500 MG IJ SOLR
500.0000 mg | Freq: Once | INTRAMUSCULAR | Status: AC
Start: 2023-09-22 — End: 2023-09-22
  Administered 2023-09-22: 500 mg via INTRAMUSCULAR

## 2023-09-22 NOTE — Telephone Encounter (Signed)
Per Dr. Drue Second, patient needs 500 mg ceftriaxone IM x 1 for gonorrhea treatment.   Sandie Ano, RN

## 2023-09-22 NOTE — Progress Notes (Signed)
Nurse Visit  Kyle Raymond [birth year]   No Known Allergies   Reviewed allergies with patient.   Medications administered: ceftriaxone 500 mg IM   Immunizations administered: none  Advised patient no sex until treatment is completed plus an additional 7 days and instructed to notify sexual partners for testing and treatment. Patient verbalized understanding and has no further questions.   Patient tolerated well.   Sandie Ano, RN

## 2023-10-18 ENCOUNTER — Other Ambulatory Visit (HOSPITAL_COMMUNITY): Payer: Self-pay

## 2023-10-18 ENCOUNTER — Other Ambulatory Visit: Payer: Self-pay

## 2023-10-18 NOTE — Progress Notes (Signed)
Specialty Pharmacy Refill Coordination Note  Kyle Raymond is a 22 y.o. male assessed today regarding refills of clinic administered specialty medication(s) Cabotegravir & Rilpivirine   Clinic requested Courier to Provider Office   Delivery date: 10/31/23   Verified address: [facility] [address]  [place] [address]   Medication will be filled on 10/30/23.

## 2023-10-19 ENCOUNTER — Other Ambulatory Visit: Payer: Self-pay

## 2023-10-30 ENCOUNTER — Other Ambulatory Visit (HOSPITAL_COMMUNITY): Payer: Self-pay

## 2023-10-31 ENCOUNTER — Telehealth: Payer: Self-pay

## 2023-10-31 NOTE — Telephone Encounter (Signed)
 [facility] Patient Advocate Encounter  Patient's medications CABENUVA have been couriered to [facility] from [facility] and will be administered at the patients appointment on 11/07/23.  Kae Heller, CPhT Specialty Pharmacy Patient [facility] for Infectious Disease Phone: [phone] Fax:  [phone]

## 2023-11-06 NOTE — Progress Notes (Unsigned)
HPI: Kyle Raymond is a 22 y.o. male who presents to the [facility] pharmacy clinic for [place] administration.  Patient Active Problem List   Diagnosis Date Noted   Healthcare maintenance 08/05/2020   HIV (human immunodeficiency virus infection) (HCC) 06/09/2020   Nausea & vomiting 06/09/2020    Patient's Medications  New Prescriptions   No medications on file  Previous Medications   AZITHROMYCIN (ZITHROMAX Z-PAK) 250 MG TABLET    Take 2 tablets daily for five days.   CABOTEGRAVIR & RILPIVIRINE ER (CABENUVA) 600 & 900 MG/3ML INJECTION    Inject 1 kit into the muscle every 2 (two) months.   DICYCLOMINE (BENTYL) 10 MG CAPSULE    Take 1 capsule (10 mg total) by mouth 4 (four) times daily -  before meals and at bedtime for 5 days.   DOXYCYCLINE (VIBRA-TABS) 100 MG TABLET    Take 1 tablet (100 mg total) by mouth 2 (two) times daily.   MOMETASONE (NASONEX) 50 MCG/ACT NASAL SPRAY    Place 2 sprays into the nose daily.   ONDANSETRON (ZOFRAN) 4 MG TABLET    Take 1 tablet (4 mg total) by mouth every 6 (six) hours.  Modified Medications   No medications on file  Discontinued Medications   No medications on file    Allergies: No Known Allergies  Labs: Lab Results  Component Value Date   HIV1RNAQUANT Not Detected 09/20/2023   HIV1RNAQUANT <20 (H) 07/11/2023   HIV1RNAQUANT Not Detected 03/08/2023   CD4TABS 685 02/03/2021   CD4TABS 665 10/27/2020   CD4TABS 491 07/06/2020    RPR and STI Lab Results  Component Value Date   LABRPR NON-REACTIVE 09/20/2023   LABRPR NON-REACTIVE 07/11/2023   LABRPR NON-REACTIVE 03/08/2023   LABRPR NON-REACTIVE 01/10/2023   LABRPR NON-REACTIVE 08/25/2022   RPRTITER 1:1 (H) 02/03/2021   RPRTITER 1:2 (H) 10/27/2020    STI Results GC CT  09/20/2023  3:55 PM Negative  Negative   09/20/2023  3:27 PM Negative  Negative   09/20/2023  3:26 PM Positive  Negative   07/11/2023 11:27 AM Negative    Negative    Negative  Negative    Negative    Negative    05/09/2023  1:57 PM Negative    Negative    Negative  Negative    Negative    Negative   03/08/2023 11:25 AM Negative    Negative    Negative  Negative    Negative    Negative   01/10/2023 11:04 AM Negative    Negative    Negative  Positive    Negative    Negative   06/13/2022  3:51 PM Negative  Negative   09/23/2021 12:49 PM Negative  Negative   08/30/2021 10:19 AM Negative  Negative   05/20/2021  5:19 PM Negative  Negative   06/09/2020 10:12 AM Negative  Negative     Hepatitis B Lab Results  Component Value Date   HEPBSAB REACTIVE (A) 01/10/2023   HEPBSAG NON-REACTIVE 06/09/2020   HEPBCAB NON-REACTIVE 06/09/2020   Hepatitis C Lab Results  Component Value Date   HEPCAB NON-REACTIVE 06/09/2020   Hepatitis A Lab Results  Component Value Date   HAV REACTIVE (A) 06/09/2020   Lipids: Lab Results  Component Value Date   CHOL 200 (H) 06/13/2022   TRIG 47 06/13/2022   HDL 66 06/13/2022   CHOLHDL 3.0 06/13/2022   LDLCALC 120 (H) 06/13/2022    TARGET DATE: The 3rd of the month  Assessment: Kyle Raymond  presents today for their maintenance Cabenuva injections. Past injections were tolerated well without issues. Last provider visit with Dr. Drue Second on 09/20/23, where he was undetectable, but positive for oral gonorrhea. He was treated with ceftriaxone 500 mg IM x 1 on 09/22/23. We will obtain a test of cure today. He reports ***.  Administered cabotegravir 600mg /69mL in left upper outer quadrant of the gluteal muscle. Administered rilpivirine 900 mg/22mL in the right upper outer quadrant of the gluteal muscle. No issues with injections. *** will follow up in 2 months for next set of injections.  Immunizations: Due for PCV20 (s/p 1 dose PCV13), Menveo, Tdap, influenza, and COVID today. Kyle Raymond *** these today.  Plan: - Cabenuva injections administered - STI testing: *** - Immunizations: *** - Next injections scheduled for *** - Call with any issues or questions  Lora Paula,  PharmD PGY-2 Infectious Diseases Pharmacy Resident [facility] for Infectious Disease

## 2023-11-07 ENCOUNTER — Other Ambulatory Visit: Payer: Self-pay

## 2023-11-07 ENCOUNTER — Other Ambulatory Visit (HOSPITAL_COMMUNITY)
Admission: RE | Admit: 2023-11-07 | Discharge: 2023-11-07 | Disposition: A | Discharge status: Home / Self Care | Payer: Medicaid Other | Source: Ambulatory Visit | Attending: Internal Medicine | Admitting: Internal Medicine

## 2023-11-07 ENCOUNTER — Ambulatory Visit (INDEPENDENT_AMBULATORY_CARE_PROVIDER_SITE_OTHER): Payer: Medicaid Other | Admitting: Pharmacist

## 2023-11-07 DIAGNOSIS — B20 Human immunodeficiency virus [HIV] disease: Secondary | ICD-10-CM | POA: Diagnosis present

## 2023-11-07 DIAGNOSIS — Z113 Encounter for screening for infections with a predominantly sexual mode of transmission: Secondary | ICD-10-CM | POA: Insufficient documentation

## 2023-11-07 MED ORDER — CABOTEGRAVIR & RILPIVIRINE ER 600 & 900 MG/3ML IM SUER
1.0000 | Freq: Once | INTRAMUSCULAR | Status: AC
  Administered 2023-11-07: 1 via INTRAMUSCULAR

## 2023-11-08 LAB — URINE CYTOLOGY ANCILLARY ONLY
Chlamydia: NEGATIVE
Comment: NEGATIVE
Comment: NORMAL
Neisseria Gonorrhea: NEGATIVE

## 2023-11-08 LAB — LIPID PANEL
Cholesterol: 187 mg/dL (ref <200)
HDL: 63 mg/dL (ref >=40)
LDL Cholesterol (Calc): 113 mg/dL — ABNORMAL HIGH
Non-HDL Cholesterol (Calc): 124 mg/dL (ref <130)
Total CHOL/HDL Ratio: 3 (calc) (ref <5.0)
Triglycerides: 37 mg/dL (ref <150)

## 2023-11-08 LAB — T-HELPER CELLS (CD4) COUNT (NOT AT ARMC)
CD4 % Helper T Cell: 35 % (ref 33–65)
CD4 T Cell Abs: 507 /uL (ref 400–1790)

## 2023-11-08 LAB — CBC
HCT: 43.8 % (ref 38.5–50.0)
Hemoglobin: 13.6 g/dL (ref 13.2–17.1)
MCH: 26.5 pg — ABNORMAL LOW (ref 27.0–33.0)
MCHC: 31.1 g/dL — ABNORMAL LOW (ref 32.0–36.0)
MCV: 85.2 fL (ref 80.0–100.0)
MPV: 9.7 fL (ref 7.5–12.5)
Platelets: 222 10*3/uL (ref 140–400)
RBC: 5.14 10*6/uL (ref 4.20–5.80)
RDW: 13.4 % (ref 11.0–15.0)
WBC: 4.7 10*3/uL (ref 3.8–10.8)

## 2023-11-08 LAB — RPR: RPR Ser Ql: NONREACTIVE

## 2023-11-08 LAB — COMPREHENSIVE METABOLIC PANEL
AG Ratio: 1.4 (calc) (ref 1.0–2.5)
ALT: 9 U/L (ref 9–46)
AST: 20 U/L (ref 10–40)
Albumin: 4.1 g/dL (ref 3.6–5.1)
Alkaline phosphatase (APISO): 58 U/L (ref 36–130)
BUN: 15 mg/dL (ref 7–25)
CO2: 28 mmol/L (ref 20–32)
Calcium: 9 mg/dL (ref 8.6–10.3)
Chloride: 103 mmol/L (ref 98–110)
Creat: 1.01 mg/dL (ref 0.60–1.24)
Globulin: 2.9 g/dL (ref 1.9–3.7)
Glucose, Bld: 93 mg/dL (ref 65–99)
Potassium: 4.4 mmol/L (ref 3.5–5.3)
Sodium: 140 mmol/L (ref 135–146)
Total Bilirubin: 0.4 mg/dL (ref 0.2–1.2)
Total Protein: 7 g/dL (ref 6.1–8.1)

## 2023-11-08 LAB — CYTOLOGY, (ORAL, ANAL, URETHRAL) ANCILLARY ONLY
Chlamydia: NEGATIVE
Chlamydia: NEGATIVE
Comment: NEGATIVE
Comment: NEGATIVE
Comment: NORMAL
Comment: NORMAL
Neisseria Gonorrhea: NEGATIVE
Neisseria Gonorrhea: NEGATIVE

## 2023-12-07 ENCOUNTER — Other Ambulatory Visit (HOSPITAL_COMMUNITY): Payer: Self-pay

## 2024-01-01 ENCOUNTER — Other Ambulatory Visit (HOSPITAL_COMMUNITY): Payer: Self-pay

## 2024-01-01 ENCOUNTER — Other Ambulatory Visit: Payer: Self-pay | Admitting: Pharmacist

## 2024-01-01 ENCOUNTER — Other Ambulatory Visit: Payer: Self-pay

## 2024-01-01 DIAGNOSIS — B20 Human immunodeficiency virus [HIV] disease: Secondary | ICD-10-CM

## 2024-01-01 MED ORDER — CABOTEGRAVIR & RILPIVIRINE ER 600 & 900 MG/3ML IM SUER
1.0000 | INTRAMUSCULAR | 5 refills | Status: DC
  Filled 2024-01-01: qty 6, 34d supply, fill #0
  Filled 2024-02-19: qty 6, 34d supply, fill #1
  Filled 2024-04-22: qty 6, 34d supply, fill #2
  Filled 2024-06-27: qty 6, 34d supply, fill #3
  Filled 2024-09-09: qty 6, 34d supply, fill #4
  Filled 2024-10-24: qty 6, 34d supply, fill #5

## 2024-01-01 NOTE — Progress Notes (Signed)
Specialty Pharmacy Refill Coordination Note  Kyle Raymond is a 23 y.o. male assessed today regarding refills of clinic administered specialty medication(s) Cabotegravir & Rilpivirine [facility])   Clinic requested Courier to Provider Office   Delivery date: 01/04/24   Verified address: [address] [place] [address]   Medication will be filled on 01/03/24.

## 2024-01-02 ENCOUNTER — Other Ambulatory Visit (HOSPITAL_COMMUNITY): Payer: Self-pay

## 2024-01-03 ENCOUNTER — Other Ambulatory Visit: Payer: Self-pay

## 2024-01-04 ENCOUNTER — Telehealth: Payer: Self-pay

## 2024-01-04 NOTE — Telephone Encounter (Signed)
[facility] Patient Advocate Encounter  Patient's medications CABENUVA have been couriered to [facility] from [facility] pharmacy and will be administered at the patients appointment on 01/08/24.  Kae Heller, CPhT Specialty Pharmacy Patient [facility] for Infectious Disease Phone: [phone] Fax:  [phone]

## 2024-01-08 ENCOUNTER — Other Ambulatory Visit (HOSPITAL_COMMUNITY)
Admission: RE | Admit: 2024-01-08 | Discharge: 2024-01-08 | Disposition: A | Discharge status: Home / Self Care | Payer: Medicaid Other | Source: Ambulatory Visit | Attending: Internal Medicine | Admitting: Internal Medicine

## 2024-01-08 ENCOUNTER — Other Ambulatory Visit: Payer: Self-pay

## 2024-01-08 ENCOUNTER — Ambulatory Visit: Payer: Medicaid Other | Admitting: Internal Medicine

## 2024-01-08 ENCOUNTER — Encounter: Payer: Self-pay | Admitting: Internal Medicine

## 2024-01-08 VITALS — BP 113/78 | HR 80 | Temp 99.2°F | Resp 16 | Wt 220.0 lb

## 2024-01-08 DIAGNOSIS — B20 Human immunodeficiency virus [HIV] disease: Secondary | ICD-10-CM

## 2024-01-08 DIAGNOSIS — Z79899 Other long term (current) drug therapy: Secondary | ICD-10-CM | POA: Diagnosis not present

## 2024-01-08 DIAGNOSIS — Z113 Encounter for screening for infections with a predominantly sexual mode of transmission: Secondary | ICD-10-CM | POA: Diagnosis present

## 2024-01-08 MED ORDER — CABOTEGRAVIR & RILPIVIRINE ER 600 & 900 MG/3ML IM SUER
1.0000 | Freq: Once | INTRAMUSCULAR | Status: AC
  Administered 2024-01-08: 1 via INTRAMUSCULAR

## 2024-01-08 NOTE — Progress Notes (Signed)
RFV: follow up for cabenuva /hiv disease  Patient ID: Kyle Raymond, male   DOB: [birth year], 23 y.o.   MRN: [id number]  HPI Kyle Raymond is a 23yo M on cabenuva injections 507/VL<20 in October 2024.  Going to [place] in April to celebrate 23 yo.   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   azithromycin (ZITHROMAX Z-PAK) 250 MG tablet Take 2 tablets daily for five days. (Patient not taking: Reported on 01/08/2024)   dicyclomine (BENTYL) 10 MG capsule Take 1 capsule (10 mg total) by mouth 4 (four) times daily -  before meals and at bedtime for 5 days. (Patient not taking: Reported on 01/08/2024)   doxycycline (VIBRA-TABS) 100 MG tablet Take 1 tablet (100 mg total) by mouth 2 (two) times daily. (Patient not taking: Reported on 01/08/2024)   mometasone (NASONEX) 50 MCG/ACT nasal spray Place 2 sprays into the nose daily. (Patient not taking: Reported on 01/08/2024)   ondansetron (ZOFRAN) 4 MG tablet Take 1 tablet (4 mg total) by mouth every 6 (six) hours. (Patient not taking: Reported on 01/08/2024)   No facility-administered encounter medications on file as of 01/08/2024.     Patient Active Problem List   Diagnosis Date Noted   Healthcare maintenance 08/05/2020   HIV (human immunodeficiency virus infection) (HCC) 06/09/2020   Nausea & vomiting 06/09/2020     Health Maintenance Due  Topic Date Due   Pneumococcal Vaccine 31-53 Years old (2 of 2 - PPSV23 or PCV20) 12/29/2020   DTaP/Tdap/Td (1 - Tdap) Never done   INFLUENZA VACCINE  07/13/2023     Review of Systems 12 point ros is negative Physical Exam   Wt 220 lb (99.8 kg)   BMI 29.84 kg/m   Physical Exam  Constitutional: He is oriented to person, place, and time. He appears well-developed and  well-nourished. No distress.  HENT:  Mouth/Throat: Oropharynx is clear and moist. No oropharyngeal exudate.  Cardiovascular: Normal rate, regular rhythm and normal heart sounds. Exam reveals no gallop and no friction rub.  No murmur heard.  Pulmonary/Chest: Effort normal and breath sounds normal. No respiratory distress. He has no wheezes.  Abdominal: Soft. Bowel sounds are normal. He exhibits no distension. There is no tenderness.  Lymphadenopathy:  He has no cervical adenopathy.  Neurological: He is alert and oriented to person, place, and time.  Skin: Skin is warm and dry. No rash noted. No erythema.  Psychiatric: He has a normal mood and affect. His behavior is normal.   Lab Results  Component Value Date   CD4TCELL 35 11/07/2023   Lab Results  Component Value Date   CD4TABS 507 11/07/2023   CD4TABS 685 02/03/2021   CD4TABS 665 10/27/2020   Lab Results  Component Value Date   HIV1RNAQUANT Not Detected 09/20/2023   Lab Results  Component Value Date   HEPBSAB REACTIVE (A) 01/10/2023   Lab Results  Component Value Date   LABRPR NON-REACTIVE 11/07/2023    CBC Lab Results  Component Value Date   WBC 4.7 11/07/2023   RBC 5.14 11/07/2023   HGB 13.6 11/07/2023   HCT 43.8 11/07/2023   PLT 222 11/07/2023   MCV 85.2 11/07/2023   MCH 26.5 (L) 11/07/2023   MCHC 31.1 (L) 11/07/2023  RDW 13.4 11/07/2023   LYMPHSABS 2,946 06/13/2022   MONOABS 0.7 05/26/2007   EOSABS 203 06/13/2022    BMET Lab Results  Component Value Date   NA 140 11/07/2023   K 4.4 11/07/2023   CL 103 11/07/2023   CO2 28 11/07/2023   GLUCOSE 93 11/07/2023   BUN 15 11/07/2023   CREATININE 1.01 11/07/2023   CALCIUM 9.0 11/07/2023   GFRNONAA >60 12/19/2021   GFRAA 120 05/20/2021      Assessment and Plan  HIV disease= plan to get Cabenuva   Long term medication management = cr is stable  Hx of sex = sti testing

## 2024-01-09 LAB — CYTOLOGY, (ORAL, ANAL, URETHRAL) ANCILLARY ONLY
Chlamydia: NEGATIVE
Chlamydia: NEGATIVE
Comment: NEGATIVE
Comment: NEGATIVE
Comment: NORMAL
Comment: NORMAL
Neisseria Gonorrhea: NEGATIVE
Neisseria Gonorrhea: NEGATIVE

## 2024-01-09 LAB — URINE CYTOLOGY ANCILLARY ONLY
Chlamydia: NEGATIVE
Comment: NEGATIVE
Comment: NORMAL
Neisseria Gonorrhea: NEGATIVE

## 2024-01-09 LAB — RPR: RPR Ser Ql: NONREACTIVE

## 2024-02-19 ENCOUNTER — Other Ambulatory Visit: Payer: Self-pay

## 2024-02-19 ENCOUNTER — Other Ambulatory Visit (HOSPITAL_COMMUNITY): Payer: Self-pay

## 2024-02-19 NOTE — Progress Notes (Signed)
 Specialty Pharmacy Refill Coordination Note  Kyle Raymond is a 23 y.o. male assessed today regarding refills of clinic administered specialty medication(s) Cabotegravir & Rilpivirine [facility])   Clinic requested Courier to Provider Office   Delivery date: 03/05/24   Verified address: [address] [place] [address]   Medication will be filled on 03/04/24.

## 2024-03-04 ENCOUNTER — Other Ambulatory Visit: Payer: Self-pay

## 2024-03-05 ENCOUNTER — Telehealth: Payer: Self-pay

## 2024-03-05 NOTE — Telephone Encounter (Signed)
 [facility] Patient Advocate Encounter  Patient's medications CABENUVA have been couriered to [facility] from [facility] and will be administered at the patients appointment on 03/12/24.  Kae Heller, CPhT Specialty Pharmacy Patient [facility] for Infectious Disease Phone: [phone] Fax:  [phone]

## 2024-03-11 NOTE — Progress Notes (Deleted)
 HPI: Kyle Raymond is a 23 y.o. male who presents to the [facility] pharmacy clinic for Kyle Raymond administration.  Patient Active Problem List   Diagnosis Date Noted   Healthcare maintenance 08/05/2020   HIV (human immunodeficiency virus infection) (HCC) 06/09/2020   Nausea & vomiting 06/09/2020    Patient's Medications  New Prescriptions   No medications on file  Previous Medications   AZITHROMYCIN (ZITHROMAX Z-PAK) 250 MG TABLET    Take 2 tablets daily for five days.   CABOTEGRAVIR & RILPIVIRINE ER (CABENUVA) 600 & 900 MG/3ML INJECTION    Inject 1 kit into the muscle every 2 (two) months.   DICYCLOMINE (BENTYL) 10 MG CAPSULE    Take 1 capsule (10 mg total) by mouth 4 (four) times daily -  before meals and at bedtime for 5 days.   DOXYCYCLINE (VIBRA-TABS) 100 MG TABLET    Take 1 tablet (100 mg total) by mouth 2 (two) times daily.   MOMETASONE (NASONEX) 50 MCG/ACT NASAL SPRAY    Place 2 sprays into the nose daily.   ONDANSETRON (ZOFRAN) 4 MG TABLET    Take 1 tablet (4 mg total) by mouth every 6 (six) hours.  Modified Medications   No medications on file  Discontinued Medications   No medications on file    Allergies: No Known Allergies  Past Medical History: Past Medical History:  Diagnosis Date   HIV infection (HCC)     Social History: Social History   Socioeconomic History   Marital status: Single    Spouse name: Not on file   Number of children: Not on file   Years of education: Not on file   Highest education level: Not on file  Occupational History   Not on file  Tobacco Use   Smoking status: Every Day    Types: E-cigarettes   Smokeless tobacco: Never  Substance and Sexual Activity   Alcohol use: Not Currently   Drug use: Yes    Frequency: 2.0 times per week    Types: Marijuana   Sexual activity: Not Currently    Partners: Male    Birth control/protection: Condom    Comment: DECLINED CONDOMS  Other Topics Concern   Not on file  Social History Narrative    Not on file   Social Drivers of Health   Financial Resource Strain: Not on File (05/11/2023)   Received from [facility], [facility]    Financial Resource Strain: 0  Food Insecurity: Not on File (05/11/2023)   Received from [place], [place]   Food Insecurity    Food: 0  Transportation Needs: Not on File (05/11/2023)   Received from [facility], [facility] Needs    Transportation: 0  Physical Activity: Not on File (05/11/2023)   Received from [place], [place]   Physical Activity    Physical Activity: 0  Stress: Not on File (05/11/2023)   Received from [facility], [place]   Stress    Stress: 0  Social Connections: Not on File (05/11/2023)   Received from [place], [place]   Social Connections    Social Connections and Isolation: 0    Labs: Lab Results  Component Value Date   HIV1RNAQUANT Not Detected 09/20/2023   HIV1RNAQUANT <20 (H) 07/11/2023   HIV1RNAQUANT Not Detected 03/08/2023   CD4TABS 507 11/07/2023   CD4TABS 685 02/03/2021   CD4TABS 665 10/27/2020    RPR and STI Lab Results  Component Value Date   LABRPR NON-REACTIVE 01/08/2024   LABRPR NON-REACTIVE  11/07/2023   LABRPR NON-REACTIVE 09/20/2023   LABRPR NON-REACTIVE 07/11/2023   LABRPR NON-REACTIVE 03/08/2023   RPRTITER 1:1 (H) 02/03/2021   RPRTITER 1:2 (H) 10/27/2020    STI Results GC CT  01/08/2024 11:51 AM Negative  Negative   01/08/2024 11:17 AM Negative  Negative   01/08/2024 11:16 AM Negative  Negative   11/07/2023 10:50 AM Negative    Negative    Negative  Negative    Negative    Negative   09/20/2023  3:55 PM Negative  Negative   09/20/2023  3:27 PM Negative  Negative   09/20/2023  3:26 PM Positive  Negative   07/11/2023 11:27 AM Negative    Negative    Negative  Negative    Negative    Negative   05/09/2023  1:57 PM Negative    Negative    Negative  Negative    Negative    Negative   03/08/2023 11:25 AM Negative    Negative    Negative  Negative    Negative    Negative    01/10/2023 11:04 AM Negative    Negative    Negative  Positive    Negative    Negative   06/13/2022  3:51 PM Negative  Negative   09/23/2021 12:49 PM Negative  Negative   08/30/2021 10:19 AM Negative  Negative   05/20/2021  5:19 PM Negative  Negative   06/09/2020 10:12 AM Negative  Negative     Hepatitis B Lab Results  Component Value Date   HEPBSAB REACTIVE (A) 01/10/2023   HEPBSAG NON-REACTIVE 06/09/2020   HEPBCAB NON-REACTIVE 06/09/2020   Hepatitis C Lab Results  Component Value Date   HEPCAB NON-REACTIVE 06/09/2020   Hepatitis A Lab Results  Component Value Date   HAV REACTIVE (A) 06/09/2020   Lipids: Lab Results  Component Value Date   CHOL 187 11/07/2023   TRIG 37 11/07/2023   HDL 63 11/07/2023   CHOLHDL 3.0 11/07/2023   LDLCALC 113 (H) 11/07/2023    TARGET DATE:  The 3rd of the month  Assessment: Kyle Raymond presents today for their maintenance Cabenuva injections. Initial/past injections were tolerated well without issues. No problems with systemic effects of injections.   Administered cabotegravir 600mg /5mL in left upper outer quadrant of the gluteal muscle. Administered rilpivirine 900 mg/42mL in the right upper outer quadrant of the gluteal muscle. Monitored patient for 10 minutes after injection. Injections were tolerated well without issue. Patient will follow up in 2 months for next injection. Will check HIV RNA today.  Eligible for COVID, flu, and PCV20 vaccines today; *** all. Per [facility], received last Menveo booster in 2021 and last Tdap given in 2015.   Plan: - Cabenuva injections administered - Check HIV RNA - Next injections scheduled for *** with Dr. Drue Second and *** with me  - Call with any issues or questions  Margarite Gouge, PharmD, CPP, BCIDP, AAHIVP Clinical Pharmacist Practitioner Infectious Diseases Clinical Pharmacist [facility]

## 2024-03-12 ENCOUNTER — Encounter: Payer: Medicaid Other | Admitting: Pharmacist

## 2024-03-12 ENCOUNTER — Encounter: Payer: Self-pay | Admitting: Pharmacist

## 2024-03-12 DIAGNOSIS — B20 Human immunodeficiency virus [HIV] disease: Secondary | ICD-10-CM

## 2024-03-14 ENCOUNTER — Ambulatory Visit (INDEPENDENT_AMBULATORY_CARE_PROVIDER_SITE_OTHER): Admitting: Pharmacist

## 2024-03-14 ENCOUNTER — Other Ambulatory Visit: Payer: Self-pay

## 2024-03-14 DIAGNOSIS — Z23 Encounter for immunization: Secondary | ICD-10-CM

## 2024-03-14 DIAGNOSIS — B20 Human immunodeficiency virus [HIV] disease: Secondary | ICD-10-CM

## 2024-03-14 MED ORDER — CABOTEGRAVIR & RILPIVIRINE ER 600 & 900 MG/3ML IM SUER
1.0000 | Freq: Once | INTRAMUSCULAR | Status: AC
  Administered 2024-03-14: 1 via INTRAMUSCULAR

## 2024-03-14 NOTE — Progress Notes (Signed)
 HPI: Kyle Raymond is a 23 y.o. male who presents to the [facility] pharmacy clinic for [place] administration.  Patient Active Problem List   Diagnosis Date Noted   Healthcare maintenance 08/05/2020   HIV (human immunodeficiency virus infection) (HCC) 06/09/2020   Nausea & vomiting 06/09/2020    Patient's Medications  New Prescriptions   No medications on file  Previous Medications   AZITHROMYCIN (ZITHROMAX Z-PAK) 250 MG TABLET    Take 2 tablets daily for five days.   CABOTEGRAVIR & RILPIVIRINE ER (CABENUVA) 600 & 900 MG/3ML INJECTION    Inject 1 kit into the muscle every 2 (two) months.   DICYCLOMINE (BENTYL) 10 MG CAPSULE    Take 1 capsule (10 mg total) by mouth 4 (four) times daily -  before meals and at bedtime for 5 days.   DOXYCYCLINE (VIBRA-TABS) 100 MG TABLET    Take 1 tablet (100 mg total) by mouth 2 (two) times daily.   MOMETASONE (NASONEX) 50 MCG/ACT NASAL SPRAY    Place 2 sprays into the nose daily.   ONDANSETRON (ZOFRAN) 4 MG TABLET    Take 1 tablet (4 mg total) by mouth every 6 (six) hours.  Modified Medications   No medications on file  Discontinued Medications   No medications on file    Allergies: No Known Allergies  Past Medical History: Past Medical History:  Diagnosis Date   HIV infection (HCC)     Social History: Social History   Socioeconomic History   Marital status: Single    Spouse name: Not on file   Number of children: Not on file   Years of education: Not on file   Highest education level: Not on file  Occupational History   Not on file  Tobacco Use   Smoking status: Every Day    Types: E-cigarettes   Smokeless tobacco: Never  Substance and Sexual Activity   Alcohol use: Not Currently   Drug use: Yes    Frequency: 2.0 times per week    Types: Marijuana   Sexual activity: Not Currently    Partners: Male    Birth control/protection: Condom    Comment: DECLINED CONDOMS  Other Topics Concern   Not on file  Social History Narrative    Not on file   Social Drivers of Health   Financial Resource Strain: Not on File (05/11/2023)   Received from [facility], [facility]    Financial Resource Strain: 0  Food Insecurity: Not on File (05/11/2023)   Received from [place], [place]   Food Insecurity    Food: 0  Transportation Needs: Not on File (05/11/2023)   Received from [facility], [facility] [facility]    Transportation: 0  Physical Activity: Not on File (05/11/2023)   Received from [place], [place]   Physical Activity    Physical Activity: 0  Stress: Not on File (05/11/2023)   Received from [facility], [place]   Stress    Stress: 0  Social Connections: Not on File (05/11/2023)   Received from [place], [place]   Social Connections    Social Connections and Isolation: 0    Labs: Lab Results  Component Value Date   HIV1RNAQUANT Not Detected 09/20/2023   HIV1RNAQUANT <20 (H) 07/11/2023   HIV1RNAQUANT Not Detected 03/08/2023   CD4TABS 507 11/07/2023   CD4TABS 685 02/03/2021   CD4TABS 665 10/27/2020    RPR and STI Lab Results  Component Value Date   LABRPR NON-REACTIVE 01/08/2024   LABRPR NON-REACTIVE  11/07/2023   LABRPR NON-REACTIVE 09/20/2023   LABRPR NON-REACTIVE 07/11/2023   LABRPR NON-REACTIVE 03/08/2023   RPRTITER 1:1 (H) 02/03/2021   RPRTITER 1:2 (H) 10/27/2020    STI Results GC CT  01/08/2024 11:51 AM Negative  Negative   01/08/2024 11:17 AM Negative  Negative   01/08/2024 11:16 AM Negative  Negative   11/07/2023 10:50 AM Negative    Negative    Negative  Negative    Negative    Negative   09/20/2023  3:55 PM Negative  Negative   09/20/2023  3:27 PM Negative  Negative   09/20/2023  3:26 PM Positive  Negative   07/11/2023 11:27 AM Negative    Negative    Negative  Negative    Negative    Negative   05/09/2023  1:57 PM Negative    Negative    Negative  Negative    Negative    Negative   03/08/2023 11:25 AM Negative    Negative    Negative  Negative    Negative    Negative    01/10/2023 11:04 AM Negative    Negative    Negative  Positive    Negative    Negative   06/13/2022  3:51 PM Negative  Negative   09/23/2021 12:49 PM Negative  Negative   08/30/2021 10:19 AM Negative  Negative   05/20/2021  5:19 PM Negative  Negative   06/09/2020 10:12 AM Negative  Negative     Hepatitis B Lab Results  Component Value Date   HEPBSAB REACTIVE (A) 01/10/2023   HEPBSAG NON-REACTIVE 06/09/2020   HEPBCAB NON-REACTIVE 06/09/2020   Hepatitis C Lab Results  Component Value Date   HEPCAB NON-REACTIVE 06/09/2020   Hepatitis A Lab Results  Component Value Date   HAV REACTIVE (A) 06/09/2020   Lipids: Lab Results  Component Value Date   CHOL 187 11/07/2023   TRIG 37 11/07/2023   HDL 63 11/07/2023   CHOLHDL 3.0 11/07/2023   LDLCALC 113 (H) 11/07/2023    TARGET DATE:  The 3rd of the month  Assessment: Kyle Raymond presents today for their maintenance Cabenuva injections. Initial/past injections were tolerated well without issues. No problems with systemic effects of injections.   Administered cabotegravir 600mg /86mL in left upper outer quadrant of the gluteal muscle. Administered rilpivirine 900 mg/12mL in the right upper outer quadrant of the gluteal muscle. Monitored patient for 10 minutes after injection. Injections were tolerated well without issue. Patient will follow up in 2 months for next injection. Will defer HIV RNA until next visit with Dr. Drue Second due to time constraints.  Eligible for COVID, flu, and PCV20 vaccines today; Accepts PCV20 today. Per [facility], received last Menveo booster in 2021 and last Tdap given in 2015.   Plan: - Cabenuva injections administered - Next injections scheduled for 5/28 with Dr. Drue Second and 7/29 with me  - Administer PCV20 - Call with any issues or questions  Margarite Gouge, PharmD, CPP, BCIDP, AAHIVP Clinical Pharmacist Practitioner Infectious Diseases Clinical Pharmacist [facility]

## 2024-04-03 ENCOUNTER — Encounter: Payer: Self-pay | Admitting: Pharmacist

## 2024-04-22 ENCOUNTER — Other Ambulatory Visit (HOSPITAL_COMMUNITY): Payer: Self-pay

## 2024-04-22 ENCOUNTER — Other Ambulatory Visit: Payer: Self-pay

## 2024-04-22 NOTE — Progress Notes (Signed)
 Specialty Pharmacy Refill Coordination Note  Kyle Raymond is a 23 y.o. male assessed today regarding refills of clinic administered specialty medication(s) Cabotegravir  & Rilpivirine  (CABENUVA )   Clinic requested Courier to Provider Office   Delivery date: 05/03/24   Verified address: [address] [place] [address]   Medication will be filled on 05/02/24.

## 2024-05-02 ENCOUNTER — Other Ambulatory Visit: Payer: Self-pay

## 2024-05-03 ENCOUNTER — Telehealth: Payer: Self-pay

## 2024-05-03 NOTE — Telephone Encounter (Signed)
 [facility] Patient Advocate Encounter  Patient's medications CABENUVA have been couriered to [facility] from [facility] and will be administered at the patients appointment on 05/08/24.  Verline Glow, CPhT Specialty Pharmacy Patient [facility] for Infectious Disease Phone: [phone] Fax:  [phone]

## 2024-05-08 ENCOUNTER — Ambulatory Visit: Payer: Medicaid Other | Admitting: Internal Medicine

## 2024-05-13 NOTE — Progress Notes (Deleted)
 HPI: Kyle Raymond is a 23 y.o. male who presents to the [facility] pharmacy clinic for Cabenuva  administration.  Patient Active Problem List   Diagnosis Date Noted   Healthcare maintenance 08/05/2020   HIV (human immunodeficiency virus infection) (HCC) 06/09/2020   Nausea & vomiting 06/09/2020    Patient's Medications  New Prescriptions   No medications on file  Previous Medications   AZITHROMYCIN  (ZITHROMAX  Z-PAK) 250 MG TABLET    Take 2 tablets daily for five days.   CABOTEGRAVIR  & RILPIVIRINE  ER (CABENUVA ) 600 & 900 MG/3ML INJECTION    Inject 1 kit into the muscle every 2 (two) months.   DICYCLOMINE  (BENTYL ) 10 MG CAPSULE    Take 1 capsule (10 mg total) by mouth 4 (four) times daily -  before meals and at bedtime for 5 days.   DOXYCYCLINE  (VIBRA -TABS) 100 MG TABLET    Take 1 tablet (100 mg total) by mouth 2 (two) times daily.   MOMETASONE  (NASONEX ) 50 MCG/ACT NASAL SPRAY    Place 2 sprays into the nose daily.   ONDANSETRON  (ZOFRAN ) 4 MG TABLET    Take 1 tablet (4 mg total) by mouth every 6 (six) hours.  Modified Medications   No medications on file  Discontinued Medications   No medications on file    Allergies: No Known Allergies  Labs: Lab Results  Component Value Date   HIV1RNAQUANT Not Detected 09/20/2023   HIV1RNAQUANT <20 (H) 07/11/2023   HIV1RNAQUANT Not Detected 03/08/2023   CD4TABS 507 11/07/2023   CD4TABS 685 02/03/2021   CD4TABS 665 10/27/2020    RPR and STI Lab Results  Component Value Date   LABRPR NON-REACTIVE 01/08/2024   LABRPR NON-REACTIVE 11/07/2023   LABRPR NON-REACTIVE 09/20/2023   LABRPR NON-REACTIVE 07/11/2023   LABRPR NON-REACTIVE 03/08/2023   RPRTITER 1:1 (H) 02/03/2021   RPRTITER 1:2 (H) 10/27/2020    STI Results GC CT  01/08/2024 11:51 AM Negative  Negative   01/08/2024 11:17 AM Negative  Negative   01/08/2024 11:16 AM Negative  Negative   11/07/2023 10:50 AM Negative    Negative    Negative  Negative    Negative    Negative    09/20/2023  3:55 PM Negative  Negative   09/20/2023  3:27 PM Negative  Negative   09/20/2023  3:26 PM Positive  Negative   07/11/2023 11:27 AM Negative    Negative    Negative  Negative    Negative    Negative   05/09/2023  1:57 PM Negative    Negative    Negative  Negative    Negative    Negative   03/08/2023 11:25 AM Negative    Negative    Negative  Negative    Negative    Negative   01/10/2023 11:04 AM Negative    Negative    Negative  Positive    Negative    Negative   06/13/2022  3:51 PM Negative  Negative   09/23/2021 12:49 PM Negative  Negative   08/30/2021 10:19 AM Negative  Negative   05/20/2021  5:19 PM Negative  Negative   06/09/2020 10:12 AM Negative  Negative     Hepatitis B Lab Results  Component Value Date   HEPBSAB REACTIVE (A) 01/10/2023   HEPBSAG NON-REACTIVE 06/09/2020   HEPBCAB NON-REACTIVE 06/09/2020   Hepatitis C Lab Results  Component Value Date   HEPCAB NON-REACTIVE 06/09/2020   Hepatitis A Lab Results  Component Value Date   HAV REACTIVE (A) 06/09/2020  Lipids: Lab Results  Component Value Date   CHOL 187 11/07/2023   TRIG 37 11/07/2023   HDL 63 11/07/2023   CHOLHDL 3.0 11/07/2023   LDLCALC 113 (H) 11/07/2023    TARGET DATE: The 3rd  Assessment: Shaya presents today for his maintenance Cabenuva  injections. Past injections were tolerated well without issues. Last HIV RNA was not detected in October 2024; will recheck today. Doing well with no issues today. Up to date on vaccines but due for tdap this August and a Menveo booster in September 2026.  Administered cabotegravir  600mg /31mL in left upper outer quadrant of the gluteal muscle. Administered rilpivirine  900 mg/3mL in the right upper outer quadrant of the gluteal muscle. No issues with injections. He will follow up in 2 months for next set of injections.  Plan: - Cabenuva  injections administered - Next injections scheduled for *** - Call with any issues or  questions  Coleson Kant L. [name] Lorusso, PharmD, BCIDP, AAHIVP, CPP Clinical Pharmacist Practitioner - Infectious Diseases Clinical Pharmacist Lead - Specialty Pharmacy [facility] for Infectious Disease 05/13/2024, 4:31 PM

## 2024-05-14 ENCOUNTER — Ambulatory Visit: Admitting: Pharmacist

## 2024-05-14 NOTE — Progress Notes (Unsigned)
 HPI: Kyle Raymond is a 23 y.o. male who presents to the [facility] pharmacy clinic for Cabenuva  administration.  Patient Active Problem List   Diagnosis Date Noted   Healthcare maintenance 08/05/2020   HIV (human immunodeficiency virus infection) (HCC) 06/09/2020   Nausea & vomiting 06/09/2020    Patient's Medications  New Prescriptions   No medications on file  Previous Medications   AZITHROMYCIN  (ZITHROMAX  Z-PAK) 250 MG TABLET    Take 2 tablets daily for five days.   CABOTEGRAVIR  & RILPIVIRINE  ER (CABENUVA ) 600 & 900 MG/3ML INJECTION    Inject 1 kit into the muscle every 2 (two) months.   DICYCLOMINE  (BENTYL ) 10 MG CAPSULE    Take 1 capsule (10 mg total) by mouth 4 (four) times daily -  before meals and at bedtime for 5 days.   DOXYCYCLINE  (VIBRA -TABS) 100 MG TABLET    Take 1 tablet (100 mg total) by mouth 2 (two) times daily.   MOMETASONE  (NASONEX ) 50 MCG/ACT NASAL SPRAY    Place 2 sprays into the nose daily.   ONDANSETRON  (ZOFRAN ) 4 MG TABLET    Take 1 tablet (4 mg total) by mouth every 6 (six) hours.  Modified Medications   No medications on file  Discontinued Medications   No medications on file    Allergies: No Known Allergies  Labs: Lab Results  Component Value Date   HIV1RNAQUANT Not Detected 09/20/2023   HIV1RNAQUANT <20 (H) 07/11/2023   HIV1RNAQUANT Not Detected 03/08/2023   CD4TABS 507 11/07/2023   CD4TABS 685 02/03/2021   CD4TABS 665 10/27/2020    RPR and STI Lab Results  Component Value Date   LABRPR NON-REACTIVE 01/08/2024   LABRPR NON-REACTIVE 11/07/2023   LABRPR NON-REACTIVE 09/20/2023   LABRPR NON-REACTIVE 07/11/2023   LABRPR NON-REACTIVE 03/08/2023   RPRTITER 1:1 (H) 02/03/2021   RPRTITER 1:2 (H) 10/27/2020    STI Results GC CT  01/08/2024 11:51 AM Negative  Negative   01/08/2024 11:17 AM Negative  Negative   01/08/2024 11:16 AM Negative  Negative   11/07/2023 10:50 AM Negative    Negative    Negative  Negative    Negative    Negative    09/20/2023  3:55 PM Negative  Negative   09/20/2023  3:27 PM Negative  Negative   09/20/2023  3:26 PM Positive  Negative   07/11/2023 11:27 AM Negative    Negative    Negative  Negative    Negative    Negative   05/09/2023  1:57 PM Negative    Negative    Negative  Negative    Negative    Negative   03/08/2023 11:25 AM Negative    Negative    Negative  Negative    Negative    Negative   01/10/2023 11:04 AM Negative    Negative    Negative  Positive    Negative    Negative   06/13/2022  3:51 PM Negative  Negative   09/23/2021 12:49 PM Negative  Negative   08/30/2021 10:19 AM Negative  Negative   05/20/2021  5:19 PM Negative  Negative   06/09/2020 10:12 AM Negative  Negative     Hepatitis B Lab Results  Component Value Date   HEPBSAB REACTIVE (A) 01/10/2023   HEPBSAG NON-REACTIVE 06/09/2020   HEPBCAB NON-REACTIVE 06/09/2020   Hepatitis C Lab Results  Component Value Date   HEPCAB NON-REACTIVE 06/09/2020   Hepatitis A Lab Results  Component Value Date   HAV REACTIVE (A) 06/09/2020  Lipids: Lab Results  Component Value Date   CHOL 187 11/07/2023   TRIG 37 11/07/2023   HDL 63 11/07/2023   CHOLHDL 3.0 11/07/2023   LDLCALC 113 (H) 11/07/2023    TARGET DATE: The 3rd  Assessment: Keyondre presents today for his maintenance Cabenuva  injections. Past injections were tolerated well without issues. Last HIV RNA was not detected in October 2024; will recheck today. Doing well with no issues today. Up to date on vaccines but due for tdap this August and a Menveo booster in September 2026.  Administered cabotegravir  600mg /46mL in left upper outer quadrant of the gluteal muscle. Administered rilpivirine  900 mg/3mL in the right upper outer quadrant of the gluteal muscle. No issues with injections. He will follow up in 2 months for next set of injections.  Plan: - Cabenuva  injections administered - Next injections scheduled for *** (try to get in w/ snider relatively  soon). - Call with any issues or questions  Estela Held, PharmD PGY-2 Infectious Diseases Pharmacy Resident [facility] 05/14/2024 10:06 PM

## 2024-05-15 ENCOUNTER — Other Ambulatory Visit: Payer: Self-pay

## 2024-05-15 ENCOUNTER — Ambulatory Visit: Admitting: Pharmacist

## 2024-05-15 ENCOUNTER — Other Ambulatory Visit (HOSPITAL_COMMUNITY)
Admission: RE | Admit: 2024-05-15 | Discharge: 2024-05-15 | Disposition: A | Discharge status: Home / Self Care | Source: Ambulatory Visit | Attending: Internal Medicine | Admitting: Internal Medicine

## 2024-05-15 DIAGNOSIS — Z113 Encounter for screening for infections with a predominantly sexual mode of transmission: Secondary | ICD-10-CM | POA: Diagnosis present

## 2024-05-15 DIAGNOSIS — B20 Human immunodeficiency virus [HIV] disease: Secondary | ICD-10-CM

## 2024-05-15 MED ORDER — CABOTEGRAVIR & RILPIVIRINE ER 600 & 900 MG/3ML IM SUER
1.0000 | Freq: Once | INTRAMUSCULAR | Status: AC
Start: 2024-05-15 — End: 2024-05-15
  Administered 2024-05-15: 1 via INTRAMUSCULAR

## 2024-05-16 LAB — CYTOLOGY, (ORAL, ANAL, URETHRAL) ANCILLARY ONLY
Chlamydia: NEGATIVE
Chlamydia: NEGATIVE
Comment: NEGATIVE
Comment: NORMAL
Comment: NEGATIVE
Comment: NORMAL
Neisseria Gonorrhea: NEGATIVE
Neisseria Gonorrhea: NEGATIVE

## 2024-05-16 LAB — URINE CYTOLOGY ANCILLARY ONLY
Chlamydia: NEGATIVE
Comment: NEGATIVE
Comment: NORMAL
Neisseria Gonorrhea: NEGATIVE

## 2024-05-17 LAB — HIV-1 RNA QUANT-NO REFLEX-BLD
HIV 1 RNA Quant: NOT DETECTED {copies}/mL
HIV-1 RNA Quant, Log: NOT DETECTED {Log_copies}/mL

## 2024-05-17 LAB — RPR: RPR Ser Ql: NONREACTIVE

## 2024-06-27 ENCOUNTER — Other Ambulatory Visit: Payer: Self-pay

## 2024-06-27 ENCOUNTER — Other Ambulatory Visit (HOSPITAL_COMMUNITY): Payer: Self-pay

## 2024-06-27 NOTE — Progress Notes (Signed)
 Specialty Pharmacy Refill Coordination Note  Kyle Raymond is a 23 y.o. male assessed today regarding refills of clinic administered specialty medication(s) Cabotegravir  & Rilpivirine  (CABENUVA )   Clinic requested Courier to Provider Office   Delivery date: 07/08/24   Verified address: [address] [place] [address]   Medication will be filled on 07/05/24.

## 2024-07-05 ENCOUNTER — Other Ambulatory Visit: Payer: Self-pay

## 2024-07-08 ENCOUNTER — Telehealth: Payer: Self-pay

## 2024-07-08 NOTE — Telephone Encounter (Signed)
 [facility] Patient Advocate Encounter  Patient's medications Cabenuva  have been couriered to [facility] from [facility] and will be administered at the patients appointment on 07/10/24.  Kyle Raymond, CPhT Specialty Pharmacy Patient [facility] Phone: [phone] Fax:  [phone]

## 2024-07-09 ENCOUNTER — Encounter: Admitting: Pharmacist

## 2024-07-10 ENCOUNTER — Encounter: Payer: Self-pay | Admitting: Internal Medicine

## 2024-07-10 ENCOUNTER — Other Ambulatory Visit: Payer: Self-pay

## 2024-07-10 ENCOUNTER — Other Ambulatory Visit (HOSPITAL_COMMUNITY)
Admission: RE | Admit: 2024-07-10 | Discharge: 2024-07-10 | Disposition: A | Discharge status: Home / Self Care | Source: Ambulatory Visit | Attending: Internal Medicine | Admitting: Internal Medicine

## 2024-07-10 ENCOUNTER — Ambulatory Visit: Admitting: Internal Medicine

## 2024-07-10 VITALS — BP 113/77 | HR 69 | Temp 98.3°F

## 2024-07-10 DIAGNOSIS — Z113 Encounter for screening for infections with a predominantly sexual mode of transmission: Secondary | ICD-10-CM | POA: Diagnosis present

## 2024-07-10 DIAGNOSIS — B20 Human immunodeficiency virus [HIV] disease: Secondary | ICD-10-CM

## 2024-07-10 DIAGNOSIS — Z23 Encounter for immunization: Secondary | ICD-10-CM

## 2024-07-10 MED ORDER — CABOTEGRAVIR & RILPIVIRINE ER 600 & 900 MG/3ML IM SUER
1.0000 | Freq: Once | INTRAMUSCULAR | Status: AC
  Administered 2024-07-10: 1 via INTRAMUSCULAR

## 2024-07-10 NOTE — Progress Notes (Signed)
 RFV: follow up for hiv disease  Patient ID: Kyle Raymond, male   DOB: 07-08-2001, 23 y.o.   MRN: [id number]  HPI [name] is a 23yo M with well controlled hiv disease, currently on cabenuva  injection. He reports his best friend passed away unexpectedly. Sexually active, wanted sti testing Received cabenuva  at clinic today  Outpatient Encounter Medications as of 07/10/2024  Medication Sig   cabotegravir  & rilpivirine  ER (CABENUVA ) 600 & 900 MG/3ML injection Inject 1 kit into the muscle every 2 (two) months.   azithromycin  (ZITHROMAX  Z-PAK) 250 MG tablet Take 2 tablets daily for five days. (Patient not taking: Reported on 01/08/2024)   dicyclomine  (BENTYL ) 10 MG capsule Take 1 capsule (10 mg total) by mouth 4 (four) times daily -  before meals and at bedtime for 5 days. (Patient not taking: Reported on 01/08/2024)   doxycycline  (VIBRA -TABS) 100 MG tablet Take 1 tablet (100 mg total) by mouth 2 (two) times daily. (Patient not taking: Reported on 01/08/2024)   mometasone  (NASONEX ) 50 MCG/ACT nasal spray Place 2 sprays into the nose daily. (Patient not taking: Reported on 01/08/2024)   ondansetron  (ZOFRAN ) 4 MG tablet Take 1 tablet (4 mg total) by mouth every 6 (six) hours. (Patient not taking: Reported on 01/08/2024)   [EXPIRED] cabotegravir  & rilpivirine  ER (CABENUVA ) 600 & 900 MG/3ML injection 1 kit    No facility-administered encounter medications on file as of 07/10/2024.     Patient Active Problem List   Diagnosis Date Noted   Healthcare maintenance 08/05/2020   HIV (human immunodeficiency virus infection) (HCC) 06/09/2020   Nausea & vomiting 06/09/2020     Health Maintenance Due  Topic Date Due   Meningococcal B Vaccine (1 of 2 - Standard) Never done     Review of Systems Review of Systems  Constitutional: Negative for fever, chills, diaphoresis, activity change, appetite change, fatigue and unexpected weight change.  HENT: Negative for congestion, sore throat, rhinorrhea,  sneezing, trouble swallowing and sinus pressure.  Eyes: Negative for photophobia and visual disturbance.  Respiratory: Negative for cough, chest tightness, shortness of breath, wheezing and stridor.  Cardiovascular: Negative for chest pain, palpitations and leg swelling.  Gastrointestinal: Negative for nausea, vomiting, abdominal pain, diarrhea, constipation, blood in stool, abdominal distention and anal bleeding.  Genitourinary: Negative for dysuria, hematuria, flank pain and difficulty urinating.  Musculoskeletal: Negative for myalgias, back pain, joint swelling, arthralgias and gait problem.  Skin: Negative for color change, pallor, rash and wound.  Neurological: Negative for dizziness, tremors, weakness and light-headedness.  Hematological: Negative for adenopathy. Does not bruise/bleed easily.  Psychiatric/Behavioral: Negative for behavioral problems, confusion, sleep disturbance, dysphoric mood, decreased concentration and agitation.   Physical Exam   BP 113/77   Pulse 69   Temp 98.3 F (36.8 C) (Oral)   SpO2 100%   Physical Exam  Constitutional: He is oriented to person, place, and time. He appears well-developed and well-nourished. No distress.  HENT:  Mouth/Throat: Oropharynx is clear and moist. No oropharyngeal exudate.  Cardiovascular: Normal rate, regular rhythm and normal heart sounds. Exam reveals no gallop and no friction rub.  No murmur heard.  Pulmonary/Chest: Effort normal and breath sounds normal. No respiratory distress. He has no wheezes.  Abdominal: Soft. Bowel sounds are normal. He exhibits no distension. There is no tenderness.  Lymphadenopathy:  He has no cervical adenopathy.  Neurological: He is alert and oriented to person, place, and time.  Skin: Skin is warm and dry. No rash noted. No erythema.  Psychiatric:  He has a normal mood and affect. His behavior is normal.   Lab Results  Component Value Date   CD4TCELL 35 11/07/2023   Lab Results  Component  Value Date   CD4TABS 507 11/07/2023   CD4TABS 685 02/03/2021   CD4TABS 665 10/27/2020   Lab Results  Component Value Date   HIV1RNAQUANT NOT DETECTED 05/15/2024   Lab Results  Component Value Date   HEPBSAB REACTIVE (A) 01/10/2023   Lab Results  Component Value Date   LABRPR NON-REACTIVE 05/15/2024    CBC Lab Results  Component Value Date   WBC 4.7 11/07/2023   RBC 5.14 11/07/2023   HGB 13.6 11/07/2023   HCT 43.8 11/07/2023   PLT 222 11/07/2023   MCV 85.2 11/07/2023   MCH 26.5 (L) 11/07/2023   MCHC 31.1 (L) 11/07/2023   RDW 13.4 11/07/2023   LYMPHSABS 2,946 06/13/2022   MONOABS 0.7 05/26/2007   EOSABS 203 06/13/2022    BMET Lab Results  Component Value Date   NA 140 11/07/2023   K 4.4 11/07/2023   CL 103 11/07/2023   CO2 28 11/07/2023   GLUCOSE 93 11/07/2023   BUN 15 11/07/2023   CREATININE 1.01 11/07/2023   CALCIUM 9.0 11/07/2023   GFRNONAA >60 12/19/2021   GFRAA 120 05/20/2021      Assessment and Plan  Hiv disease= getting cabenuva  injection today. Getting labs -- to see VL still well controlled  Long term medications = will check cr  History of sexual active adult = will do Sti testing  Hpv vaccine series already completed

## 2024-07-10 NOTE — Patient Instructions (Signed)
 Smoking Cessation: QuitlineNC 1-800-QUIT-NOW 707-701-6721); Espaol: 1-855-Djelo-Ya (1-780-445-4976) [email or web address]-[email or web address].[email or web address]

## 2024-07-11 LAB — CYTOLOGY, (ORAL, ANAL, URETHRAL) ANCILLARY ONLY
Chlamydia: NEGATIVE
Chlamydia: NEGATIVE
Comment: NEGATIVE
Comment: NEGATIVE
Comment: NORMAL
Comment: NORMAL
Neisseria Gonorrhea: NEGATIVE
Neisseria Gonorrhea: NEGATIVE

## 2024-07-11 LAB — RPR: RPR Ser Ql: NONREACTIVE

## 2024-07-11 LAB — URINE CYTOLOGY ANCILLARY ONLY
Chlamydia: NEGATIVE
Comment: NEGATIVE
Comment: NORMAL
Neisseria Gonorrhea: NEGATIVE

## 2024-09-09 ENCOUNTER — Other Ambulatory Visit: Payer: Self-pay

## 2024-09-09 ENCOUNTER — Other Ambulatory Visit (HOSPITAL_COMMUNITY): Payer: Self-pay

## 2024-09-09 NOTE — Progress Notes (Signed)
 Specialty Pharmacy Refill Coordination Note  Kyle Raymond is a 23 y.o. male assessed today regarding refills of clinic administered specialty medication(s) Cabotegravir  & Rilpivirine  (CABENUVA )   Clinic requested Courier to Provider Office   Delivery date: 09/12/24   Verified address: [address] [place] [address]   Medication will be filled on 09/11/24.

## 2024-09-11 ENCOUNTER — Other Ambulatory Visit: Payer: Self-pay

## 2024-09-11 ENCOUNTER — Other Ambulatory Visit (HOSPITAL_COMMUNITY): Payer: Self-pay

## 2024-09-12 ENCOUNTER — Telehealth: Payer: Self-pay

## 2024-09-12 NOTE — Telephone Encounter (Signed)
 [facility] Patient Advocate Encounter  Patient's medications Cabenuva  have been couriered to [facility] from [facility] and will be administered at the patients appointment on 09/16/24.  Arland Hutchinson, CPhT Specialty Pharmacy Patient [facility] for Infectious Disease Phone: [phone] Fax:  [phone]

## 2024-09-13 NOTE — Progress Notes (Signed)
 HPI: Kyle Raymond is a 23 y.o. male who presents to the [facility] pharmacy clinic for Cabenuva  administration.  Patient Active Problem List   Diagnosis Date Noted   Healthcare maintenance 08/05/2020   HIV (human immunodeficiency virus infection) (HCC) 06/09/2020   Nausea & vomiting 06/09/2020    Patient's Medications  New Prescriptions   No medications on file  Previous Medications   AZITHROMYCIN  (ZITHROMAX  Z-PAK) 250 MG TABLET    Take 2 tablets daily for five days.   CABOTEGRAVIR  & RILPIVIRINE  ER (CABENUVA ) 600 & 900 MG/3ML INJECTION    Inject 1 kit into the muscle every 2 (two) months.   DICYCLOMINE  (BENTYL ) 10 MG CAPSULE    Take 1 capsule (10 mg total) by mouth 4 (four) times daily -  before meals and at bedtime for 5 days.   DOXYCYCLINE  (VIBRA -TABS) 100 MG TABLET    Take 1 tablet (100 mg total) by mouth 2 (two) times daily.   MOMETASONE  (NASONEX ) 50 MCG/ACT NASAL SPRAY    Place 2 sprays into the nose daily.   ONDANSETRON  (ZOFRAN ) 4 MG TABLET    Take 1 tablet (4 mg total) by mouth every 6 (six) hours.  Modified Medications   No medications on file  Discontinued Medications   No medications on file    Allergies: No Known Allergies  Labs: Lab Results  Component Value Date   HIV1RNAQUANT NOT DETECTED 05/15/2024   HIV1RNAQUANT Not Detected 09/20/2023   HIV1RNAQUANT <20 (H) 07/11/2023   CD4TABS 507 11/07/2023   CD4TABS 685 02/03/2021   CD4TABS 665 10/27/2020    RPR and STI Lab Results  Component Value Date   LABRPR NON-REACTIVE 07/10/2024   LABRPR NON-REACTIVE 05/15/2024   LABRPR NON-REACTIVE 01/08/2024   LABRPR NON-REACTIVE 11/07/2023   LABRPR NON-REACTIVE 09/20/2023   RPRTITER 1:1 (H) 02/03/2021   RPRTITER 1:2 (H) 10/27/2020    STI Results GC CT  07/10/2024 10:07 AM Negative    Negative    Negative  Negative    Negative    Negative   05/15/2024  2:03 PM Negative    Negative    Negative  Negative    Negative    Negative   01/08/2024 11:51 AM Negative   Negative   01/08/2024 11:17 AM Negative  Negative   01/08/2024 11:16 AM Negative  Negative   11/07/2023 10:50 AM Negative    Negative    Negative  Negative    Negative    Negative   09/20/2023  3:55 PM Negative  Negative   09/20/2023  3:27 PM Negative  Negative   09/20/2023  3:26 PM Positive  Negative   07/11/2023 11:27 AM Negative    Negative    Negative  Negative    Negative    Negative   05/09/2023  1:57 PM Negative    Negative    Negative  Negative    Negative    Negative   03/08/2023 11:25 AM Negative    Negative    Negative  Negative    Negative    Negative   01/10/2023 11:04 AM Negative    Negative    Negative  Positive    Negative    Negative   06/13/2022  3:51 PM Negative  Negative   09/23/2021 12:49 PM Negative  Negative   08/30/2021 10:19 AM Negative  Negative   05/20/2021  5:19 PM Negative  Negative   06/09/2020 10:12 AM Negative  Negative     Hepatitis B Lab Results  Component Value  Date   HEPBSAB REACTIVE (A) 01/10/2023   HEPBSAG NON-REACTIVE 06/09/2020   HEPBCAB NON-REACTIVE 06/09/2020   Hepatitis C Lab Results  Component Value Date   HEPCAB NON-REACTIVE 06/09/2020   Hepatitis A Lab Results  Component Value Date   HAV REACTIVE (A) 06/09/2020   Lipids: Lab Results  Component Value Date   CHOL 187 11/07/2023   TRIG 37 11/07/2023   HDL 63 11/07/2023   CHOLHDL 3.0 11/07/2023   LDLCALC 113 (H) 11/07/2023    TARGET DATE: The 3rd  Assessment: Kyle Raymond presents today for his maintenance Cabenuva  injections. Past injections were tolerated well without issues. Last HIV RNA was not detected in June. Doing well with no issues today. No symptoms or exposures but requesting sexual health screenings today. Politely declines flu and covid vaccines but will consider at next appointment.  Administered cabotegravir  600mg /57mL in left upper outer quadrant of the gluteal muscle. Administered rilpivirine  900 mg/3mL in the right upper outer quadrant of the  gluteal muscle. No issues with injections. He will follow up in 2 months for next set of injections.  Plan: - Cabenuva  injections administered - RPR and urine/rectal/pharyngeal cytologies for GC/chlamydia  - Next injections scheduled for 11/13/24 with Alan and 01/13/25 with Dr. Luiz - Call with any issues or questions  Nadean Montanaro L. Keston Seever, PharmD, BCIDP, AAHIVP, CPP Clinical Pharmacist Practitioner - Infectious Diseases Clinical Pharmacist Lead - Specialty Pharmacy [facility] for Infectious Disease

## 2024-09-16 ENCOUNTER — Ambulatory Visit: Admitting: Pharmacist

## 2024-09-16 ENCOUNTER — Other Ambulatory Visit (HOSPITAL_COMMUNITY)
Admission: RE | Admit: 2024-09-16 | Discharge: 2024-09-16 | Disposition: A | Discharge status: Home / Self Care | Source: Ambulatory Visit | Attending: Internal Medicine | Admitting: Internal Medicine

## 2024-09-16 ENCOUNTER — Other Ambulatory Visit: Payer: Self-pay

## 2024-09-16 DIAGNOSIS — Z113 Encounter for screening for infections with a predominantly sexual mode of transmission: Secondary | ICD-10-CM

## 2024-09-16 DIAGNOSIS — B20 Human immunodeficiency virus [HIV] disease: Secondary | ICD-10-CM

## 2024-09-16 MED ORDER — CABOTEGRAVIR & RILPIVIRINE ER 600 & 900 MG/3ML IM SUER
1.0000 | Freq: Once | INTRAMUSCULAR | Status: AC
  Administered 2024-09-16: 1 via INTRAMUSCULAR

## 2024-09-17 LAB — CYTOLOGY, (ORAL, ANAL, URETHRAL) ANCILLARY ONLY
Chlamydia: NEGATIVE
Chlamydia: NEGATIVE
Comment: NEGATIVE
Comment: NEGATIVE
Comment: NORMAL
Comment: NORMAL
Neisseria Gonorrhea: NEGATIVE
Neisseria Gonorrhea: NEGATIVE

## 2024-09-17 LAB — URINE CYTOLOGY ANCILLARY ONLY
Chlamydia: NEGATIVE
Comment: NEGATIVE
Comment: NORMAL
Neisseria Gonorrhea: NEGATIVE

## 2024-09-17 LAB — RPR: RPR Ser Ql: NONREACTIVE

## 2024-10-23 ENCOUNTER — Other Ambulatory Visit (HOSPITAL_COMMUNITY): Payer: Self-pay

## 2024-10-24 ENCOUNTER — Other Ambulatory Visit: Payer: Self-pay

## 2024-10-24 ENCOUNTER — Other Ambulatory Visit (HOSPITAL_COMMUNITY): Payer: Self-pay

## 2024-10-24 NOTE — Progress Notes (Signed)
 Specialty Pharmacy Refill Coordination Note  Kyle Raymond is a 23 y.o. male assessed today regarding refills of clinic administered specialty medication(s) Cabotegravir  & Rilpivirine  (CABENUVA )   Clinic requested Courier to Provider Office   Delivery date: 11/04/24   Verified address: [address] [place] [address]   Medication will be filled on 11/01/24.

## 2024-11-01 ENCOUNTER — Other Ambulatory Visit: Payer: Self-pay

## 2024-11-01 ENCOUNTER — Other Ambulatory Visit (HOSPITAL_COMMUNITY): Payer: Self-pay

## 2024-11-01 NOTE — Progress Notes (Deleted)
 HPI: Kyle Raymond is a 23 y.o. male who presents to the [facility] pharmacy clinic for Cabenuva  administration.  Referring ID Physician: Dr. Luiz  Patient Active Problem List   Diagnosis Date Noted   Healthcare maintenance 08/05/2020   HIV (human immunodeficiency virus infection) (HCC) 06/09/2020   Nausea & vomiting 06/09/2020    Patient's Medications  New Prescriptions   No medications on file  Previous Medications   AZITHROMYCIN  (ZITHROMAX  Z-PAK) 250 MG TABLET    Take 2 tablets daily for five days.   CABOTEGRAVIR  & RILPIVIRINE  ER (CABENUVA ) 600 & 900 MG/3ML INJECTION    Inject 1 kit into the muscle every 2 (two) months.   DICYCLOMINE  (BENTYL ) 10 MG CAPSULE    Take 1 capsule (10 mg total) by mouth 4 (four) times daily -  before meals and at bedtime for 5 days.   DOXYCYCLINE  (VIBRA -TABS) 100 MG TABLET    Take 1 tablet (100 mg total) by mouth 2 (two) times daily.   MOMETASONE  (NASONEX ) 50 MCG/ACT NASAL SPRAY    Place 2 sprays into the nose daily.   ONDANSETRON  (ZOFRAN ) 4 MG TABLET    Take 1 tablet (4 mg total) by mouth every 6 (six) hours.  Modified Medications   No medications on file  Discontinued Medications   No medications on file    Allergies: No Known Allergies  Past Medical History: Past Medical History:  Diagnosis Date   HIV infection (HCC)     Social History: Social History   Socioeconomic History   Marital status: Single    Spouse name: Not on file   Number of children: Not on file   Years of education: Not on file   Highest education level: Not on file  Occupational History   Not on file  Tobacco Use   Smoking status: Every Day    Types: E-cigarettes   Smokeless tobacco: Never  Substance and Sexual Activity   Alcohol use: Not Currently   Drug use: Yes    Frequency: 2.0 times per week    Types: Marijuana   Sexual activity: Not Currently    Partners: Male    Birth control/protection: Condom    Comment: DECLINED CONDOMS  Other Topics Concern   Not  on file  Social History Narrative   Not on file   Social Drivers of Health   Financial Resource Strain: Not on File (05/11/2023)   Received from [employer]    Financial Resource Strain: 0  Food Insecurity: Not on File (05/11/2023)   Received from [facility] Insecurity    Food: 0  Transportation Needs: Not on File (05/11/2023)   Received from [employer] Needs    Transportation: 0  Physical Activity: Not on File (05/11/2023)   Received from [facility]   Physical Activity    Physical Activity: 0  Stress: Not on File (05/11/2023)   Received from [facility]   Stress    Stress: 0  Social Connections: Not on File (05/11/2023)   Received from [facility]   Social Connections    Social Connections and Isolation: 0    Labs: Lab Results  Component Value Date   HIV1RNAQUANT NOT DETECTED 05/15/2024   HIV1RNAQUANT Not Detected 09/20/2023   HIV1RNAQUANT <20 (H) 07/11/2023   CD4TABS 507 11/07/2023   CD4TABS 685 02/03/2021   CD4TABS 665 10/27/2020    RPR and STI Lab Results  Component Value Date   LABRPR NON-REACTIVE 09/16/2024   LABRPR NON-REACTIVE  07/10/2024   LABRPR NON-REACTIVE 05/15/2024   LABRPR NON-REACTIVE 01/08/2024   LABRPR NON-REACTIVE 11/07/2023   RPRTITER 1:1 (H) 02/03/2021   RPRTITER 1:2 (H) 10/27/2020    STI Results GC CT  09/16/2024 11:21 AM Negative    Negative    Negative  Negative    Negative    Negative   07/10/2024 10:07 AM Negative    Negative    Negative  Negative    Negative    Negative   05/15/2024  2:03 PM Negative    Negative    Negative  Negative    Negative    Negative   01/08/2024 11:51 AM Negative  Negative   01/08/2024 11:17 AM Negative  Negative   01/08/2024 11:16 AM Negative  Negative   11/07/2023 10:50 AM Negative    Negative    Negative  Negative    Negative    Negative   09/20/2023  3:55 PM Negative  Negative   09/20/2023  3:27 PM Negative  Negative   09/20/2023  3:26 PM Positive  Negative    07/11/2023 11:27 AM Negative    Negative    Negative  Negative    Negative    Negative   05/09/2023  1:57 PM Negative    Negative    Negative  Negative    Negative    Negative   03/08/2023 11:25 AM Negative    Negative    Negative  Negative    Negative    Negative   01/10/2023 11:04 AM Negative    Negative    Negative  Positive    Negative    Negative   06/13/2022  3:51 PM Negative  Negative   09/23/2021 12:49 PM Negative  Negative   08/30/2021 10:19 AM Negative  Negative   05/20/2021  5:19 PM Negative  Negative   06/09/2020 10:12 AM Negative  Negative     Hepatitis B Lab Results  Component Value Date   HEPBSAB REACTIVE (A) 01/10/2023   HEPBSAG NON-REACTIVE 06/09/2020   HEPBCAB NON-REACTIVE 06/09/2020   Hepatitis C Lab Results  Component Value Date   HEPCAB NON-REACTIVE 06/09/2020   Hepatitis A Lab Results  Component Value Date   HAV REACTIVE (A) 06/09/2020   Lipids: Lab Results  Component Value Date   CHOL 187 11/07/2023   TRIG 37 11/07/2023   HDL 63 11/07/2023   CHOLHDL 3.0 11/07/2023   LDLCALC 113 (H) 11/07/2023    TARGET DATE:  The 3rd of the month  Assessment: Kyle Raymond presents today for their maintenance Cabenuva  injections. Initial/past injections were tolerated well without issues. No problems with systemic effects of injections.   Administered cabotegravir  600mg /53mL in left upper outer quadrant of the gluteal muscle. Administered rilpivirine  900 mg/3mL in the right upper outer quadrant of the gluteal muscle. Monitored patient for 10 minutes after injection. Injections were tolerated well without issue. Patient will follow up in 2 months for next injection. Will check HIV RNA today along with annual lab work.  Eligible for COVID and flu vaccines; ***. *** STI testing.   Plan: - Administer Cabenuva  injections  - Check HIV RNA, CMP, lipid panel, CBC, CD4 count, and *** - Next injections scheduled for 01/13/25 with Dr. Luiz  - Call with any issues  or questions  Alan Geralds, PharmD, CPP, BCIDP, AAHIVP Clinical Pharmacist Practitioner Infectious Diseases Clinical Pharmacist [facility]

## 2024-11-04 ENCOUNTER — Telehealth: Payer: Self-pay

## 2024-11-04 NOTE — Telephone Encounter (Signed)
 [facility] Patient Advocate Encounter  Patient's medications Cabenuva  have been couriered to [facility] from [facility] and will be administered at the patients appointment on 11/13/24.  Arland Hutchinson, CPhT Specialty Pharmacy Patient [facility] for Infectious Disease Phone: [phone] Fax:  [phone]

## 2024-11-11 ENCOUNTER — Other Ambulatory Visit (HOSPITAL_COMMUNITY): Payer: Self-pay

## 2024-11-13 ENCOUNTER — Ambulatory Visit: Admitting: Pharmacist

## 2024-11-13 ENCOUNTER — Other Ambulatory Visit: Payer: Self-pay

## 2024-11-13 ENCOUNTER — Other Ambulatory Visit (HOSPITAL_COMMUNITY)
Admission: RE | Admit: 2024-11-13 | Discharge: 2024-11-13 | Disposition: A | Discharge status: Home / Self Care | Source: Ambulatory Visit | Attending: Internal Medicine | Admitting: Internal Medicine

## 2024-11-13 DIAGNOSIS — Z113 Encounter for screening for infections with a predominantly sexual mode of transmission: Secondary | ICD-10-CM | POA: Diagnosis present

## 2024-11-13 DIAGNOSIS — B20 Human immunodeficiency virus [HIV] disease: Secondary | ICD-10-CM

## 2024-11-13 MED ORDER — CABOTEGRAVIR & RILPIVIRINE ER 600 & 900 MG/3ML IM SUER
1.0000 | Freq: Once | INTRAMUSCULAR | Status: AC
  Administered 2024-11-13: 1 via INTRAMUSCULAR

## 2024-11-13 NOTE — Progress Notes (Signed)
 HPI: Kyle Raymond is a 23 y.o. male who presents to the [facility] pharmacy clinic for Cabenuva  administration.  Referring ID Physician: Dr. Luiz  Patient Active Problem List   Diagnosis Date Noted   Healthcare maintenance 08/05/2020   HIV (human immunodeficiency virus infection) (HCC) 06/09/2020   Nausea & vomiting 06/09/2020    Patient's Medications  New Prescriptions   No medications on file  Previous Medications   CABOTEGRAVIR  & RILPIVIRINE  ER (CABENUVA ) 600 & 900 MG/3ML INJECTION    Inject 1 kit into the muscle every 2 (two) months.  Modified Medications   No medications on file  Discontinued Medications   AZITHROMYCIN  (ZITHROMAX  Z-PAK) 250 MG TABLET    Take 2 tablets daily for five days.   DICYCLOMINE  (BENTYL ) 10 MG CAPSULE    Take 1 capsule (10 mg total) by mouth 4 (four) times daily -  before meals and at bedtime for 5 days.   DOXYCYCLINE  (VIBRA -TABS) 100 MG TABLET    Take 1 tablet (100 mg total) by mouth 2 (two) times daily.   MOMETASONE  (NASONEX ) 50 MCG/ACT NASAL SPRAY    Place 2 sprays into the nose daily.   ONDANSETRON  (ZOFRAN ) 4 MG TABLET    Take 1 tablet (4 mg total) by mouth every 6 (six) hours.    Allergies: No Known Allergies  Past Medical History: Past Medical History:  Diagnosis Date   HIV infection (HCC)     Social History: Social History   Socioeconomic History   Marital status: Single    Spouse name: Not on file   Number of children: Not on file   Years of education: Not on file   Highest education level: Not on file  Occupational History   Not on file  Tobacco Use   Smoking status: Every Day    Types: E-cigarettes   Smokeless tobacco: Never  Substance and Sexual Activity   Alcohol use: Not Currently   Drug use: Yes    Frequency: 2.0 times per week    Types: Marijuana   Sexual activity: Not Currently    Partners: Male    Birth control/protection: Condom    Comment: DECLINED CONDOMS  Other Topics Concern   Not on file  Social History  Narrative   Not on file   Social Drivers of Health   Financial Resource Strain: Not on File (05/11/2023)   Received from [employer] [facility]    Financial Resource Strain: 0  Food Insecurity: Not on File (05/11/2023)   Received from [facility] Insecurity    Food: 0  Transportation Needs: Not on File (05/11/2023)   Received from [facility] Needs    Transportation: 0  Physical Activity: Not on File (05/11/2023)   Received from [facility]   Physical Activity    Physical Activity: 0  Stress: Not on File (05/11/2023)   Received from [facility]   Stress    Stress: 0  Social Connections: Not on File (05/11/2023)   Received from [facility]   Social Connections    Social Connections and Isolation: 0    Labs: Lab Results  Component Value Date   HIV1RNAQUANT NOT DETECTED 05/15/2024   HIV1RNAQUANT Not Detected 09/20/2023   HIV1RNAQUANT <20 (H) 07/11/2023   CD4TABS 507 11/07/2023   CD4TABS 685 02/03/2021   CD4TABS 665 10/27/2020    RPR and STI Lab Results  Component Value Date   LABRPR NON-REACTIVE 09/16/2024   LABRPR NON-REACTIVE 07/10/2024   LABRPR NON-REACTIVE 05/15/2024  LABRPR NON-REACTIVE 01/08/2024   LABRPR NON-REACTIVE 11/07/2023   RPRTITER 1:1 (H) 02/03/2021   RPRTITER 1:2 (H) 10/27/2020    STI Results GC CT  09/16/2024 11:21 AM Negative    Negative    Negative  Negative    Negative    Negative   07/10/2024 10:07 AM Negative    Negative    Negative  Negative    Negative    Negative   05/15/2024  2:03 PM Negative    Negative    Negative  Negative    Negative    Negative   01/08/2024 11:51 AM Negative  Negative   01/08/2024 11:17 AM Negative  Negative   01/08/2024 11:16 AM Negative  Negative   11/07/2023 10:50 AM Negative    Negative    Negative  Negative    Negative    Negative   09/20/2023  3:55 PM Negative  Negative   09/20/2023  3:27 PM Negative  Negative   09/20/2023  3:26 PM Positive  Negative   07/11/2023 11:27 AM Negative     Negative    Negative  Negative    Negative    Negative   05/09/2023  1:57 PM Negative    Negative    Negative  Negative    Negative    Negative   03/08/2023 11:25 AM Negative    Negative    Negative  Negative    Negative    Negative   01/10/2023 11:04 AM Negative    Negative    Negative  Positive    Negative    Negative   06/13/2022  3:51 PM Negative  Negative   09/23/2021 12:49 PM Negative  Negative   08/30/2021 10:19 AM Negative  Negative   05/20/2021  5:19 PM Negative  Negative   06/09/2020 10:12 AM Negative  Negative     Hepatitis B Lab Results  Component Value Date   HEPBSAB REACTIVE (A) 01/10/2023   HEPBSAG NON-REACTIVE 06/09/2020   HEPBCAB NON-REACTIVE 06/09/2020   Hepatitis C Lab Results  Component Value Date   HEPCAB NON-REACTIVE 06/09/2020   Hepatitis A Lab Results  Component Value Date   HAV REACTIVE (A) 06/09/2020   Lipids: Lab Results  Component Value Date   CHOL 187 11/07/2023   TRIG 37 11/07/2023   HDL 63 11/07/2023   CHOLHDL 3.0 11/07/2023   LDLCALC 113 (H) 11/07/2023    TARGET DATE:  The 3rd of the month  Assessment: Kyle Raymond presents today for their maintenance Cabenuva  injections. Initial/past injections were tolerated well without issues. No problems with systemic effects of injections.   Administered cabotegravir  600mg /67mL in left upper outer quadrant of the gluteal muscle. Administered rilpivirine  900 mg/3mL in the right upper outer quadrant of the gluteal muscle. Monitored patient for 10 minutes after injection. Injections were tolerated well without issue. Patient will follow up in 2 months for next injection. Will check HIV RNA today along with routine annual labs. Accepts full STI testing today though denies any concerns for STIs.   Eligible for flu and COVID vaccines which he defers as he feels nauseous and sick today.   Plan: - Administer Cabenuva  injections  - Check HIV RNA, CMP, CBC, CD4 count, lipid panel, RPR and  urine/oral/rectal cytologies - Next injections scheduled for 01/13/25 with Dr. Luiz and 03/12/25 with me  - Call with any issues or questions  Alan Geralds, PharmD, CPP, BCIDP, AAHIVP Clinical Pharmacist Practitioner Infectious Diseases Clinical Pharmacist [facility]

## 2024-11-14 LAB — CYTOLOGY, (ORAL, ANAL, URETHRAL) ANCILLARY ONLY
Chlamydia: NEGATIVE
Chlamydia: NEGATIVE
Comment: NEGATIVE
Comment: NEGATIVE
Comment: NORMAL
Comment: NORMAL
Neisseria Gonorrhea: NEGATIVE
Neisseria Gonorrhea: NEGATIVE

## 2024-11-14 LAB — URINE CYTOLOGY ANCILLARY ONLY
Chlamydia: NEGATIVE
Comment: NEGATIVE
Comment: NORMAL
Neisseria Gonorrhea: NEGATIVE

## 2024-11-14 LAB — T-HELPER CELLS (CD4) COUNT (NOT AT ARMC)
CD4 % Helper T Cell: 34 % (ref 33–65)
CD4 T Cell Abs: 423 /uL (ref 400–1790)

## 2024-11-15 LAB — COMPREHENSIVE METABOLIC PANEL WITH GFR
AG Ratio: 1.4 (calc) (ref 1.0–2.5)
ALT: 17 U/L (ref 9–46)
AST: 23 U/L (ref 10–40)
Albumin: 4.8 g/dL (ref 3.6–5.1)
Alkaline phosphatase (APISO): 69 U/L (ref 36–130)
BUN: 14 mg/dL (ref 7–25)
CO2: 27 mmol/L (ref 20–32)
Calcium: 9.7 mg/dL (ref 8.6–10.3)
Chloride: 103 mmol/L (ref 98–110)
Creat: 0.85 mg/dL (ref 0.60–1.24)
Globulin: 3.4 g/dL (ref 1.9–3.7)
Glucose, Bld: 82 mg/dL (ref 65–99)
Potassium: 4.1 mmol/L (ref 3.5–5.3)
Sodium: 140 mmol/L (ref 135–146)
Total Bilirubin: 0.5 mg/dL (ref 0.2–1.2)
Total Protein: 8.2 g/dL — ABNORMAL HIGH (ref 6.1–8.1)
eGFR: 127 mL/min/1.73m2 (ref >=60)

## 2024-11-15 LAB — LIPID PANEL
Cholesterol: 210 mg/dL — ABNORMAL HIGH (ref <200)
HDL: 55 mg/dL (ref >=40)
LDL Cholesterol (Calc): 140 mg/dL — ABNORMAL HIGH
Non-HDL Cholesterol (Calc): 155 mg/dL — ABNORMAL HIGH (ref <130)
Total CHOL/HDL Ratio: 3.8 (calc) (ref <5.0)
Triglycerides: 61 mg/dL (ref <150)

## 2024-11-15 LAB — CBC WITH DIFFERENTIAL/PLATELET
Absolute Lymphocytes: 1607 {cells}/uL (ref 850–3900)
Absolute Monocytes: 638 {cells}/uL (ref 200–950)
Basophils Absolute: 17 {cells}/uL (ref 0–200)
Basophils Relative: 0.2 %
Eosinophils Absolute: 179 {cells}/uL (ref 15–500)
Eosinophils Relative: 2.1 %
HCT: 47.1 % (ref 39.4–51.1)
Hemoglobin: 14.6 g/dL (ref 13.2–17.1)
MCH: 26.1 pg — ABNORMAL LOW (ref 27.0–33.0)
MCHC: 31 g/dL — ABNORMAL LOW (ref 31.6–35.4)
MCV: 84.1 fL (ref 81.4–101.7)
MPV: 10.2 fL (ref 7.5–12.5)
Monocytes Relative: 7.5 %
Neutro Abs: 6061 {cells}/uL (ref 1500–7800)
Neutrophils Relative %: 71.3 %
Platelets: 254 Thousand/uL (ref 140–400)
RBC: 5.6 Million/uL (ref 4.20–5.80)
RDW: 13.7 % (ref 11.0–15.0)
Total Lymphocyte: 18.9 %
WBC: 8.5 Thousand/uL (ref 3.8–10.8)

## 2024-11-15 LAB — HIV-1 RNA QUANT-NO REFLEX-BLD
HIV 1 RNA Quant: NOT DETECTED {copies}/mL
HIV-1 RNA Quant, Log: NOT DETECTED {Log_copies}/mL

## 2024-11-15 LAB — SYPHILIS: RPR W/REFLEX TO RPR TITER AND TREPONEMAL ANTIBODIES, TRADITIONAL SCREENING AND DIAGNOSIS ALGORITHM: RPR Ser Ql: NONREACTIVE

## 2025-01-06 ENCOUNTER — Other Ambulatory Visit: Payer: Self-pay

## 2025-01-06 ENCOUNTER — Other Ambulatory Visit: Payer: Self-pay | Admitting: Pharmacist

## 2025-01-06 ENCOUNTER — Other Ambulatory Visit (HOSPITAL_COMMUNITY): Payer: Self-pay

## 2025-01-06 DIAGNOSIS — B20 Human immunodeficiency virus [HIV] disease: Secondary | ICD-10-CM

## 2025-01-06 MED ORDER — CABOTEGRAVIR & RILPIVIRINE ER 600 & 900 MG/3ML IM SUER
1.0000 | INTRAMUSCULAR | 5 refills | Status: AC
  Filled 2025-01-06: qty 6, 34d supply, fill #0

## 2025-01-06 NOTE — Progress Notes (Signed)
 Specialty Pharmacy Refill Coordination Note  Kyle Raymond is a 24 y.o. male assessed today regarding refills of clinic administered specialty medication(s) Cabotegravir  & Rilpivirine  (CABENUVA )   Clinic requested Courier to Provider Office   Delivery date: 01/09/25   Verified address: [address] [place] [address]   Medication will be filled on 01/08/25.

## 2025-01-08 ENCOUNTER — Other Ambulatory Visit: Payer: Self-pay

## 2025-01-09 ENCOUNTER — Telehealth: Payer: Self-pay

## 2025-01-09 NOTE — Telephone Encounter (Signed)
 [facility] Patient Advocate Encounter  Patient's medications Cabenuva  have been couriered to [facility] from [facility] and will be administered at the patients appointment on 01/13/25.  Arland Hutchinson, CPhT Specialty Pharmacy Patient [facility] Phone: [phone] Fax:  [phone]

## 2025-01-13 ENCOUNTER — Ambulatory Visit: Payer: Self-pay | Admitting: Internal Medicine

## 2025-01-16 ENCOUNTER — Ambulatory Visit: Admitting: Internal Medicine

## 2025-01-16 ENCOUNTER — Encounter: Payer: Self-pay | Admitting: Internal Medicine

## 2025-01-16 ENCOUNTER — Other Ambulatory Visit: Payer: Self-pay

## 2025-01-16 VITALS — BP 114/72 | HR 76 | Temp 98.3°F | Ht 72.0 in | Wt 246.0 lb

## 2025-01-16 DIAGNOSIS — B20 Human immunodeficiency virus [HIV] disease: Secondary | ICD-10-CM

## 2025-01-16 MED ORDER — CABOTEGRAVIR & RILPIVIRINE ER 600 & 900 MG/3ML IM SUER
1.0000 | Freq: Once | INTRAMUSCULAR | Status: AC
  Administered 2025-01-16: 1 via INTRAMUSCULAR

## 2025-01-16 NOTE — Progress Notes (Unsigned)
"    ° ° °  Patient ID: Kyle Raymond, male   DOB: [birth year], 24 y.o.   MRN: [id number]  HPI 24yo M with HIV disease, on Cabenuva    Monogamous relationship  Outpatient Encounter Medications as of 01/16/2025  Medication Sig   cabotegravir  & rilpivirine  ER (CABENUVA ) 600 & 900 MG/3ML injection Inject 1 kit into the muscle every 2 (two) months.   No facility-administered encounter medications on file as of 01/16/2025.     Patient Active Problem List   Diagnosis Date Noted   Healthcare maintenance 08/05/2020   HIV (human immunodeficiency virus infection) (HCC) 06/09/2020   Nausea & vomiting 06/09/2020     Health Maintenance Due  Topic Date Due   Meningococcal B Vaccine (1 of 2 - Standard) Never done   COVID-19 Vaccine (2 - Pfizer risk series) 08/12/2020   Influenza Vaccine  07/12/2024     Review of Systems  Physical Exam   BP 114/72   Pulse 76   Temp 98.3 F (36.8 C) (Oral)   Ht 6' (1.829 m)   Wt 246 lb (111.6 kg)   SpO2 97%   BMI 33.36 kg/m    Lab Results  Component Value Date   CD4TCELL 34 11/13/2024   Lab Results  Component Value Date   CD4TABS 423 11/13/2024   CD4TABS 507 11/07/2023   CD4TABS 685 02/03/2021   Lab Results  Component Value Date   HIV1RNAQUANT NOT DETECTED 11/13/2024   Lab Results  Component Value Date   HEPBSAB REACTIVE (A) 01/10/2023   Lab Results  Component Value Date   LABRPR NON-REACTIVE 11/13/2024    CBC Lab Results  Component Value Date   WBC 8.5 11/13/2024   RBC 5.60 11/13/2024   HGB 14.6 11/13/2024   HCT 47.1 11/13/2024   PLT 254 11/13/2024   MCV 84.1 11/13/2024   MCH 26.1 (L) 11/13/2024   MCHC 31.0 (L) 11/13/2024   RDW 13.7 11/13/2024   LYMPHSABS 2,946 06/13/2022   MONOABS 0.7 05/26/2007   EOSABS 179 11/13/2024    BMET Lab Results  Component Value Date   NA 140 11/13/2024   K 4.1 11/13/2024   CL 103 11/13/2024   CO2 27 11/13/2024   GLUCOSE 82 11/13/2024   BUN 14 11/13/2024   CREATININE 0.85 11/13/2024    CALCIUM 9.7 11/13/2024   GFRNONAA >60 12/19/2021   GFRAA 120 05/20/2021      Assessment and Plan  Cabenuva -- See back in 4 mo; ahs appt in 2 months Labs at next visit   "

## 2025-03-12 ENCOUNTER — Ambulatory Visit: Admitting: Pharmacist
